# Patient Record
Sex: Male | Born: 1955 | Marital: Single | Smoking: Former smoker
Health system: Southern US, Community
[De-identification: ages and names within clinical notes are randomized; demographics above are authoritative.]

## PROBLEM LIST (undated history)

## (undated) DIAGNOSIS — D3A012 Benign carcinoid tumor of the ileum: Secondary | ICD-10-CM

## (undated) DIAGNOSIS — D3A029 Benign carcinoid tumor of the large intestine, unspecified portion: Secondary | ICD-10-CM

## (undated) DIAGNOSIS — K219 Gastro-esophageal reflux disease without esophagitis: Secondary | ICD-10-CM

## (undated) HISTORY — DX: Benign carcinoid tumor of the large intestine, unspecified portion: D3A.029

## (undated) HISTORY — PX: INGUINAL HERNIA REPAIR: SUR1180

## (undated) HISTORY — DX: Gastro-esophageal reflux disease without esophagitis: K21.9

## (undated) HISTORY — DX: Benign carcinoid tumor of the ileum: D3A.012

---

## 2011-02-14 HISTORY — PX: HEMICOLECTOMY: SHX854

## 2011-10-18 ENCOUNTER — Encounter: Payer: Managed Care, Other (non HMO) | Admitting: Internal Medicine

## 2011-10-18 DIAGNOSIS — D3A00 Benign carcinoid tumor of unspecified site: Secondary | ICD-10-CM

## 2011-10-20 ENCOUNTER — Other Ambulatory Visit: Payer: Self-pay | Admitting: *Deleted

## 2011-10-20 ENCOUNTER — Encounter: Payer: Self-pay | Admitting: *Deleted

## 2011-10-23 ENCOUNTER — Encounter: Payer: Self-pay | Admitting: Internal Medicine

## 2011-10-23 ENCOUNTER — Ambulatory Visit: Payer: Managed Care, Other (non HMO) | Admitting: Internal Medicine

## 2011-10-23 ENCOUNTER — Ambulatory Visit (INDEPENDENT_AMBULATORY_CARE_PROVIDER_SITE_OTHER): Payer: Managed Care, Other (non HMO) | Admitting: Internal Medicine

## 2011-10-23 VITALS — BP 130/74 | HR 68 | Ht 74.0 in | Wt 182.4 lb

## 2011-10-23 DIAGNOSIS — D3A012 Benign carcinoid tumor of the ileum: Secondary | ICD-10-CM

## 2011-10-23 DIAGNOSIS — Z136 Encounter for screening for cardiovascular disorders: Secondary | ICD-10-CM

## 2011-10-23 DIAGNOSIS — Z01810 Encounter for preprocedural cardiovascular examination: Secondary | ICD-10-CM

## 2011-10-23 DIAGNOSIS — D3A00 Benign carcinoid tumor of unspecified site: Secondary | ICD-10-CM

## 2011-10-23 NOTE — Assessment & Plan Note (Signed)
Echo assessment here is important as noted above. His cardio vascular risks however are quite low for surgery. His functional status is good. His family history is noteworthy that his electrocardiogram is normal and he has no known coronary disease of his own. If his echo is normal for ventricular function review, surgery for his carcinoid can be undertaken with acceptable risk.

## 2011-10-23 NOTE — Assessment & Plan Note (Signed)
The patient has a carcinoid tumor. Apparently there are some perineal mets and he is undergoing further evaluation for metastatic disease which is apparently quite common. Cardiac assessment by echo is indicated to look for valvular involvement of the tricuspid and or pulmonic valves. Apparently serial cardiac assessment is indicated if there is residual tumor.

## 2011-10-23 NOTE — Progress Notes (Signed)
CARDIOLOGY CONSULT NOTE  Patient ID: Michael Wood, MRN: [id number], DOB/AGE: November 10, 1955 56 y.o. Admit date: (Not on file) Date of Consult: 10/23/2011  Primary Physician: Ignatius Specking., MD Primary Cardiologist: new  Chief Complaint: preoperative screeening   HPI Michael Wood is a 56 y.o. male  seen at the request of Dr. 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  This was found during routine colonoscopy  He has no cardiac symptoms. He has a strong family history of heart disease. He denies chest pain. He is able to climb 2 or 3 flights of stairs without shortness of breath. 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 He has, he thinks, of a low HDL low LDL. He does not smoke.  He denies flushing and diarrhea.   Past Medical History  Diagnosis Date  . Carcinoid tumor of ileum     ileocecal valve  . Esophageal reflux       Surgical History:  Past Surgical History  Procedure Date  . Inguinal hernia repair      Home Meds: Prior to Admission medications   Medication Sig Start Date End Date Taking? Authorizing Provider  aspirin 81 MG tablet Take 81 mg by mouth daily.   Yes Historical Provider, MD  Multiple Vitamin (MULTIVITAMIN) tablet Take 1 tablet by mouth daily.   Yes Historical Provider, MD  omeprazole (PRILOSEC) 20 MG capsule Take 1 capsule by mouth Daily. 09/22/11  Yes Historical Provider, MD    Allergies: No Known Allergies  History   Social History  . Marital Status: Unknown    Spouse Name: N/A    Number of Children: N/A  . Years of Education: N/A   Occupational History  . Not on file.   Social History Main Topics  . Smoking status: Former Smoker -- 1.0 packs/day for 35 years    Types: Cigarettes    Quit date: 02/14/2008  . Smokeless tobacco: Not on file  . Alcohol Use: Not on file  . Drug Use: Not on file  . Sexually Active: Not on file   Other Topics Concern  . Not on file   Social History Narrative  . No  narrative on file    Married. He stopped smoking when his wife a couple of strokes apparently in the context of collagen vascular disease  Family History  Problem Relation Age of Onset  . Heart attack Father 45  . Stroke Mother   . Diabetes Sister   . Other Daughter     healthy  . Lung cancer Other     uncle  . Heart disease Other     paternal side     ROS:  Please see the history of present illness.   [id number]  All other systems reviewed and negative.    Physical Exam: Blood pressure 130/74, pulse 68, height 6\' 2"  (1.88 m), weight 182 lb 6.4 oz (82.736 kg). General: Well developed, well nourished male in no acute distress. Head: Normocephalic, atraumatic, sclera non-icteric, no xanthomas, nares are without discharge. Lymph Nodes:  none Neck: Negative for carotid bruits. JVD not elevated. Back:without scoliosis kyphosis  Lungs: Clear bilaterally to auscultation without wheezes, rales, or rhonchi. Breathing is unlabored. Heart: RRR with S1 S2. No murmur . No rubs, or gallops appreciated. Abdomen: Soft, non-tender, non-distended with normoactive bowel sounds. No hepatomegaly. No rebound/guarding. No obvious abdominal masses. Msk:  Strength and tone appear normal for age. Extremities: No clubbing or cyanosis. No  edema.  Distal  pedal pulses are 2+ and equal bilaterally. Skin: Warm and Dry Neuro: Alert and oriented X 3. CN III-XII intact Grossly normal sensory and motor function . Psych:  Responds to questions appropriately with a normal affect.      Labs:   Radiology/Studies:  No results found.  EKG: Sinus rhythm at 68 Intervals 13/09/37 Low-voltage limb leads Otherwise normal   Assessment and Plan:  Sherryl Manges

## 2011-10-23 NOTE — Patient Instructions (Signed)
   Echo  Office will contact with results  Based on above results

## 2011-11-01 ENCOUNTER — Other Ambulatory Visit: Payer: Self-pay

## 2011-11-01 ENCOUNTER — Other Ambulatory Visit (INDEPENDENT_AMBULATORY_CARE_PROVIDER_SITE_OTHER): Payer: Managed Care, Other (non HMO)

## 2011-11-01 DIAGNOSIS — Z136 Encounter for screening for cardiovascular disorders: Secondary | ICD-10-CM

## 2011-11-01 DIAGNOSIS — I369 Nonrheumatic tricuspid valve disorder, unspecified: Secondary | ICD-10-CM

## 2011-11-01 DIAGNOSIS — D3A00 Benign carcinoid tumor of unspecified site: Secondary | ICD-10-CM

## 2011-11-06 ENCOUNTER — Telehealth: Payer: Self-pay | Admitting: *Deleted

## 2011-11-06 NOTE — Telephone Encounter (Signed)
Patient notified

## 2011-11-06 NOTE — Telephone Encounter (Signed)
Notes Recorded by Duke Salvia, MD on 11/03/2011 at 5:49 PM Please Inform Patient Normal echo without evidence of carcinoid Also plz fax to his surgeon  Attempted to notify patient.  Left message.    Copy of results faxed to [facility] & Dr. Eliseo Squires office this morning.

## 2011-11-14 ENCOUNTER — Ambulatory Visit: Payer: Managed Care, Other (non HMO) | Admitting: Cardiology

## 2011-12-05 ENCOUNTER — Encounter: Payer: Managed Care, Other (non HMO) | Admitting: Internal Medicine

## 2011-12-05 DIAGNOSIS — C7A029 Malignant carcinoid tumor of the large intestine, unspecified portion: Secondary | ICD-10-CM

## 2011-12-11 DIAGNOSIS — C7A021 Malignant carcinoid tumor of the cecum: Secondary | ICD-10-CM

## 2012-01-08 DIAGNOSIS — C7A021 Malignant carcinoid tumor of the cecum: Secondary | ICD-10-CM

## 2012-02-05 DIAGNOSIS — C7A098 Malignant carcinoid tumors of other sites: Secondary | ICD-10-CM

## 2012-04-26 DIAGNOSIS — C7A019 Malignant carcinoid tumor of the small intestine, unspecified portion: Secondary | ICD-10-CM

## 2012-05-06 ENCOUNTER — Encounter: Payer: Managed Care, Other (non HMO) | Admitting: Internal Medicine

## 2012-05-06 DIAGNOSIS — D3A00 Benign carcinoid tumor of unspecified site: Secondary | ICD-10-CM

## 2012-05-27 DIAGNOSIS — C7A00 Malignant carcinoid tumor of unspecified site: Secondary | ICD-10-CM

## 2012-06-24 DIAGNOSIS — D3A00 Benign carcinoid tumor of unspecified site: Secondary | ICD-10-CM

## 2012-07-22 DIAGNOSIS — C7A019 Malignant carcinoid tumor of the small intestine, unspecified portion: Secondary | ICD-10-CM

## 2012-08-19 DIAGNOSIS — C7A021 Malignant carcinoid tumor of the cecum: Secondary | ICD-10-CM

## 2012-08-29 DIAGNOSIS — C7A021 Malignant carcinoid tumor of the cecum: Secondary | ICD-10-CM

## 2012-09-16 DIAGNOSIS — E340 Carcinoid syndrome: Secondary | ICD-10-CM

## 2012-09-16 DIAGNOSIS — D3A00 Benign carcinoid tumor of unspecified site: Secondary | ICD-10-CM

## 2012-10-16 DIAGNOSIS — C7A021 Malignant carcinoid tumor of the cecum: Secondary | ICD-10-CM

## 2012-10-16 DIAGNOSIS — E340 Carcinoid syndrome: Secondary | ICD-10-CM

## 2012-10-16 DIAGNOSIS — C7A012 Malignant carcinoid tumor of the ileum: Secondary | ICD-10-CM

## 2012-11-15 DIAGNOSIS — D3A021 Benign carcinoid tumor of the cecum: Secondary | ICD-10-CM

## 2012-11-15 DIAGNOSIS — R05 Cough: Secondary | ICD-10-CM

## 2012-11-18 DIAGNOSIS — C7A021 Malignant carcinoid tumor of the cecum: Secondary | ICD-10-CM

## 2012-11-18 DIAGNOSIS — C7B8 Other secondary neuroendocrine tumors: Secondary | ICD-10-CM

## 2012-12-16 DIAGNOSIS — C7B8 Other secondary neuroendocrine tumors: Secondary | ICD-10-CM

## 2012-12-16 DIAGNOSIS — C7A012 Malignant carcinoid tumor of the ileum: Secondary | ICD-10-CM

## 2013-01-13 DIAGNOSIS — C7A021 Malignant carcinoid tumor of the cecum: Secondary | ICD-10-CM

## 2013-01-13 DIAGNOSIS — C7A012 Malignant carcinoid tumor of the ileum: Secondary | ICD-10-CM

## 2013-01-13 DIAGNOSIS — E340 Carcinoid syndrome: Secondary | ICD-10-CM

## 2017-04-27 ENCOUNTER — Other Ambulatory Visit (HOSPITAL_COMMUNITY): Payer: Self-pay | Admitting: Pharmacist

## 2017-05-03 ENCOUNTER — Other Ambulatory Visit (HOSPITAL_COMMUNITY): Payer: Self-pay | Admitting: Hematology

## 2017-05-03 NOTE — Progress Notes (Signed)
START OFF PATHWAY REGIMEN - [Other Dx]   [id number]:Lanreotide q28 Days:   A cycles is every 28 days:     Lanreotide   **Always confirm dose/schedule in your pharmacy ordering system**  Patient Characteristics: Intent of Therapy: Non-Curative / Palliative Intent, Discussed with Patient 

## 2017-05-04 ENCOUNTER — Other Ambulatory Visit (HOSPITAL_COMMUNITY): Payer: Self-pay | Admitting: Hematology

## 2017-05-04 ENCOUNTER — Other Ambulatory Visit (HOSPITAL_COMMUNITY): Payer: Self-pay | Admitting: Pharmacist

## 2017-05-10 ENCOUNTER — Inpatient Hospital Stay (HOSPITAL_COMMUNITY): Payer: Managed Care, Other (non HMO) | Attending: Hematology | Admitting: Hematology

## 2017-05-10 ENCOUNTER — Encounter (HOSPITAL_COMMUNITY): Payer: Self-pay | Admitting: Hematology

## 2017-05-10 ENCOUNTER — Inpatient Hospital Stay (HOSPITAL_COMMUNITY): Payer: Managed Care, Other (non HMO)

## 2017-05-10 DIAGNOSIS — C7B02 Secondary carcinoid tumors of liver: Secondary | ICD-10-CM | POA: Insufficient documentation

## 2017-05-10 DIAGNOSIS — C7A012 Malignant carcinoid tumor of the ileum: Secondary | ICD-10-CM | POA: Insufficient documentation

## 2017-05-10 DIAGNOSIS — D3A012 Benign carcinoid tumor of the ileum: Secondary | ICD-10-CM

## 2017-05-10 MED ORDER — LANREOTIDE ACETATE 120 MG/0.5ML ~~LOC~~ SOLN
120.0000 mg | Freq: Once | SUBCUTANEOUS | Status: AC
  Administered 2017-05-10: 120 mg via SUBCUTANEOUS
  Filled 2017-05-10: qty 120

## 2017-05-10 NOTE — Progress Notes (Signed)
Michael Wood presents today for injection per the provider's orders.  Lanreotide administration without incident; see MAR for injection details.  Patient tolerated procedure well and without incident.  No questions or complaints noted at this time.  Discharged ambulatory.

## 2017-05-10 NOTE — Progress Notes (Signed)
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  HISTORY OF PRESENTING ILLNESS:  Michael Wood 62 y.o. 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  He had episodes of night sweats in October 2018 and November 2018 in December 2018.  He does have occasional flushing episodes.  He presented with right upper quadrant pain and lab abnormalities including elevated alkaline phosphatase.  CT scan done showed liver metastasis on 03/17/2017.  He had a liver biopsy done on 03/29/2017 which showed metastatic neuroendocrine tumor, grade 2.  He is working a full-time job at a [employer] in [place].  His right upper quadrant pain comes on with certain movements, 5-6 out of 10, lasts only a few seconds, occasionally sharp.  It goes away on its own.  He denies any night sweats at this time.  He has occasional diarrhea depends on what he eats.  His appetite is normal.    MEDICAL HISTORY:  Past Medical History:  Diagnosis Date  . Carcinoid tumor of colon   . Carcinoid tumor of ileum    ileocecal valve  . Esophageal reflux     SURGICAL HISTORY: Past Surgical History:  Procedure Laterality Date  . HEMICOLECTOMY Right 2013  . INGUINAL HERNIA REPAIR      SOCIAL HISTORY: Social History   Socioeconomic History  . Marital status: Single    Spouse name: Not on file  . Number of children: Not on file  . Years of education: Not on file  . Highest education level: Not on file  Occupational History  . Not on file  Social Needs  . Financial resource strain: Not on file  . Food insecurity:    Worry: Not on file    Inability: Not on file  . Transportation needs:   Medical: Not on file    Non-medical: Not on file  Tobacco Use  . Smoking status: Former Smoker    Packs/day: 1.00    Years: 35.00    Pack years: 35.00    Types: Cigarettes    Last attempt to quit: 02/14/2008    Years since quitting: 9.2  . Smokeless tobacco: Never Used  Substance and Sexual Activity  . Alcohol use: Never    Frequency: Never  . Drug use: Never  . Sexual activity: Not on file  Lifestyle  . Physical activity:    Days per week: Not on file    Minutes per session: Not on file  . Stress: Not on file  Relationships  . Social connections:    Talks on phone: Not on file    Gets together: Not on file    Attends religious service: Not on file    Active member of club or organization: Not on file    Attends meetings of clubs or organizations: Not on file    Relationship status: Not on file  . Intimate partner violence:    Fear of current or ex partner: Not on file    Emotionally abused: Not on file    Physically abused: Not on file    Forced sexual activity: Not on file  Other Topics Concern  . Not on file  Social History Narrative  . Not on  file    FAMILY HISTORY: Family History  Problem Relation Age of Onset  . Heart attack Father 97  . Stroke Mother   . Diabetes Sister   . Other Daughter        healthy  . Lung cancer Other        uncle  . Heart disease Other        paternal side    ALLERGIES:  has No Known Allergies.  MEDICATIONS:  No current outpatient medications on file.   No current facility-administered medications for this visit.     REVIEW OF SYSTEMS:   Constitutional: Denies fevers, chills or abnormal night sweats.  Occasional flushing present. Eyes: Denies blurriness of vision, double vision or watery eyes Ears, nose, mouth, throat, and face: Denies mucositis or sore throat Respiratory: Denies cough, dyspnea or wheezes Cardiovascular: Denies palpitation, chest discomfort or lower extremity swelling Gastrointestinal:  Denies nausea,  heartburn or change in bowel habits.  Occasional pain in the right upper quadrant on certain movements.  Occasional diarrhea depending on what he eats. Skin: Denies abnormal skin rashes Lymphatics: Denies new lymphadenopathy or easy bruising Neurological:Denies numbness, tingling or new weaknesses Behavioral/Psych: Mood is stable, no new changes  All other systems were reviewed with the patient and are negative.  PHYSICAL EXAMINATION: ECOG PERFORMANCE STATUS: 0 - Asymptomatic  Vitals:   05/10/17 1125  BP: 128/78  Pulse: 80  Resp: 18  Temp: 98.2 F (36.8 C)  SpO2: 98%   Filed Weights   05/10/17 1124 05/10/17 1125  Weight: 161 lb 3.2 oz (73.1 kg) 161 lb 3.2 oz (73.1 kg)    GENERAL:alert, no distress and comfortable SKIN: skin color, texture, turgor are normal, no rashes or significant lesions EYES: normal, conjunctiva are pink and non-injected, sclera clear OROPHARYNX:no exudate, no erythema and lips, buccal mucosa, and tongue normal  NECK: supple, thyroid normal size, non-tender, without nodularity LYMPH:  no palpable lymphadenopathy in the cervical, axillary or inguinal LUNGS: clear to auscultation and percussion with normal breathing effort HEART: regular rate & rhythm and no murmurs and no lower extremity edema ABDOMEN: Border of the liver felt on deep inspiration.  No palpable splenomegaly. Musculoskeletal:no cyanosis of digits and no clubbing  PSYCH: alert & oriented x 3 with fluent speech NEURO: no focal motor/sensory deficits  LABORATORY DATA:  I have reviewed the data as listed No results found for: WBC, HGB, HCT, MCV, PLT   Chemistry   No results found for: NA, K, CL, CO2, BUN, CREATININE, GLU No results found for: CALCIUM, ALKPHOS, AST, ALT, BILITOT     RADIOGRAPHIC STUDIES: I have personally reviewed the radiological images as listed and agreed with the findings in the report. No results found.  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  His right upper quadrant pain improved after that.  He has some pain which lasts a few seconds on certain movements in the right upper quadrant region.  He has occasional flushing which is stable.  He denies any night sweats.  He has on and off diarrhea based on what he eats.  His appetite is normal.  He is working full-time job.  He will proceed with lanreotide 120 mg every 4 weeks today and in 1 month.  I will see him back in 8 weeks with a repeat CT scan, blood and urine tests.  With somatostatin analogs,  less than 10% have objective tumor shrinkage, although prolonged periods of stable disease are seen in majority of patients.  If there is progression, adding everolimus will be an option.  Peptide receptor radioligand therapy (PRRT) is also an option following progression on the somatostatin analogue.  Orders Placed This Encounter  Procedures  . CT Abdomen W Contrast    Standing Status:   Future    Standing Expiration Date:   05/10/2018    Order Specific Question:   If indicated for the ordered procedure, I authorize the administration of contrast media per Radiology protocol    Answer:   Yes    Order Specific Question:   Preferred imaging location?    Answer:   [facility]    Order Specific Question:   Radiology Contrast Protocol - do NOT remove file path    Answer:   [email or web address]    Order Specific Question:   Reason for Exam additional comments    Answer:   Follow-up of metastatic carcinoid to the liver, compare with CT scans from 03/17/2017 at outside facility, loaded on PACS    All questions were answered. The patient knows to call the clinic with any problems, questions or concerns.     Derek Jack, MD 05/10/2017 4:59 PM

## 2017-05-10 NOTE — Assessment & Plan Note (Signed)
1.  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  His right upper quadrant pain improved after that.  He has some pain which lasts a few seconds on certain movements in the right upper quadrant region.  He has occasional flushing which is stable.  He denies any night sweats.  He has on and off diarrhea based on what he eats.  His appetite is normal.  He is working full-time job.  He will proceed with lanreotide 120 mg every 4 weeks today and in 1 month.  I will see him back in 8 weeks with a repeat CT scan, blood and urine tests.  With somatostatin analogs, less than 10% have objective tumor shrinkage, although prolonged periods of stable disease are seen in majority of patients.  If there is progression, adding everolimus will be an option.  Peptide receptor radioligand therapy (PRRT) is also an option following progression on the somatostatin analogue.

## 2017-06-07 ENCOUNTER — Encounter (HOSPITAL_COMMUNITY): Payer: Self-pay

## 2017-06-07 ENCOUNTER — Other Ambulatory Visit: Payer: Self-pay

## 2017-06-07 ENCOUNTER — Emergency Department (HOSPITAL_COMMUNITY)
Admission: EM | Admit: 2017-06-07 | Discharge: 2017-06-07 | Disposition: A | Discharge status: Home / Self Care | Payer: Managed Care, Other (non HMO) | Attending: Emergency Medicine | Admitting: Emergency Medicine

## 2017-06-07 ENCOUNTER — Emergency Department (HOSPITAL_COMMUNITY): Payer: Managed Care, Other (non HMO)

## 2017-06-07 ENCOUNTER — Inpatient Hospital Stay (HOSPITAL_COMMUNITY): Payer: Managed Care, Other (non HMO) | Attending: Hematology

## 2017-06-07 VITALS — BP 151/67 | HR 94 | Temp 102.1°F | Resp 18

## 2017-06-07 DIAGNOSIS — C787 Secondary malignant neoplasm of liver and intrahepatic bile duct: Secondary | ICD-10-CM | POA: Insufficient documentation

## 2017-06-07 DIAGNOSIS — R509 Fever, unspecified: Secondary | ICD-10-CM | POA: Insufficient documentation

## 2017-06-07 DIAGNOSIS — C7B02 Secondary carcinoid tumors of liver: Secondary | ICD-10-CM | POA: Insufficient documentation

## 2017-06-07 DIAGNOSIS — D3A012 Benign carcinoid tumor of the ileum: Secondary | ICD-10-CM

## 2017-06-07 DIAGNOSIS — Z87891 Personal history of nicotine dependence: Secondary | ICD-10-CM | POA: Diagnosis not present

## 2017-06-07 DIAGNOSIS — C7A012 Malignant carcinoid tumor of the ileum: Secondary | ICD-10-CM | POA: Insufficient documentation

## 2017-06-07 DIAGNOSIS — R079 Chest pain, unspecified: Secondary | ICD-10-CM | POA: Diagnosis not present

## 2017-06-07 LAB — CBC WITH DIFFERENTIAL/PLATELET
Basophils Absolute: 0 10*3/uL (ref 0.0–0.1)
Basophils Relative: 0 %
Eosinophils Absolute: 0 10*3/uL (ref 0.0–0.7)
Eosinophils Relative: 0 %
HEMATOCRIT: 37.5 % — AB (ref 39.0–52.0)
HEMOGLOBIN: 12.4 g/dL — AB (ref 13.0–17.0)
LYMPHS ABS: 1.1 10*3/uL (ref 0.7–4.0)
Lymphocytes Relative: 11 %
MCH: 30.3 pg (ref 26.0–34.0)
MCHC: 33.1 g/dL (ref 30.0–36.0)
MCV: 91.7 fL (ref 78.0–100.0)
MONOS PCT: 13 %
Monocytes Absolute: 1.3 10*3/uL — ABNORMAL HIGH (ref 0.1–1.0)
NEUTROS ABS: 7.3 10*3/uL (ref 1.7–7.7)
Neutrophils Relative %: 76 %
Platelets: 372 10*3/uL (ref 150–400)
RBC: 4.09 MIL/uL — ABNORMAL LOW (ref 4.22–5.81)
RDW: 15.1 % (ref 11.5–15.5)
WBC: 9.7 10*3/uL (ref 4.0–10.5)

## 2017-06-07 LAB — COMPREHENSIVE METABOLIC PANEL
ALK PHOS: 371 U/L — AB (ref 38–126)
ALT: 30 U/L (ref 17–63)
ANION GAP: 14 (ref 5–15)
AST: 40 U/L (ref 15–41)
Albumin: 2.8 g/dL — ABNORMAL LOW (ref 3.5–5.0)
BUN: 11 mg/dL (ref 6–20)
CALCIUM: 8.6 mg/dL — AB (ref 8.9–10.3)
CO2: 26 mmol/L (ref 22–32)
Chloride: 97 mmol/L — ABNORMAL LOW (ref 101–111)
Creatinine, Ser: 1.04 mg/dL (ref 0.61–1.24)
GFR calc non Af Amer: >60 mL/min (ref >60)
Glucose, Bld: 134 mg/dL — ABNORMAL HIGH (ref 65–99)
Potassium: 3.8 mmol/L (ref 3.5–5.1)
Sodium: 137 mmol/L (ref 135–145)
TOTAL PROTEIN: 7.2 g/dL (ref 6.5–8.1)
Total Bilirubin: 2.2 mg/dL — ABNORMAL HIGH (ref 0.3–1.2)

## 2017-06-07 MED ORDER — IBUPROFEN 400 MG PO TABS
400.0000 mg | ORAL_TABLET | Freq: Once | ORAL | Status: AC
  Administered 2017-06-07: 400 mg via ORAL
  Filled 2017-06-07: qty 1

## 2017-06-07 MED ORDER — LANREOTIDE ACETATE 120 MG/0.5ML ~~LOC~~ SOLN
120.0000 mg | Freq: Once | SUBCUTANEOUS | Status: DC
  Filled 2017-06-07: qty 120

## 2017-06-07 NOTE — Discharge Instructions (Signed)
Contact your primary care physician to be rechecked tomorrow or early next week.  Return to the emergency if you develop vomiting, abdominal pain, shortness of breath or if you feel worse for any reason

## 2017-06-07 NOTE — ED Triage Notes (Signed)
Pt is a cancer patient. Was sent over by cancer center to be evaluated due to sharp stabbing pain in right rib that radiates to upper shoulder. Only happens when he takes a deep breath, laugh, or sudden movement.

## 2017-06-07 NOTE — Progress Notes (Signed)
Pt reports fever, chills, night sweats, RU chest pain and right upper back pain, sharp, worse with deep breaths and movement, x 2 weeks.  Pt is noted to be febrile on assessment of VS.  Dr. Delton Coombes notified of pt complaints and vital signs.  Hold lanreotide injection today and send to ED for further evaluation per MD.  Pt escorted to ED triage.

## 2017-06-07 NOTE — ED Provider Notes (Signed)
[facility] EMERGENCY DEPARTMENT Provider Note   CSN: [id number] Arrival date & time: 06/07/17  1534     History   Chief Complaint Chief Complaint  Patient presents with  . Chest Pain  . Shoulder Pain    HPI Michael Wood is a 62 y.o. male.  HPI She reports intermittent fevers October 2018 he has had right-sided chest pain at right mid ribs for the past 2 weeks.  He denies cough denies shortness of breath denies nausea or vomiting denies abdominal pain.  He was noted to have a temperature of 102.1 today immediately prior to getting lanreotide injection.  Not received any treatment today however he is been treating himself with Advil for fevers for the past 2 weeks.  He was sent here for further evaluation.  Denies any shoulder pain.  No other associated symptoms.  His lanreotide injection was withheld.  Urinary symptoms denies abdominal pain denies headache Past Medical History:  Diagnosis Date  . Carcinoid tumor of colon   . Carcinoid tumor of ileum    ileocecal valve  . Esophageal reflux     Patient Active Problem List   Diagnosis Date Noted  . Metastatic malignant carcinoid tumor to liver ([place]) 05/10/2017  . Preoperative cardiovascular examination 10/23/2011  . Carcinoid tumor of ileum     Past Surgical History:  Procedure Laterality Date  . HEMICOLECTOMY Right 2013  . INGUINAL HERNIA REPAIR          Home Medications    Prior to Admission medications   Not on File    Family History Family History  Problem Relation Age of Onset  . Heart attack Father 35  . Stroke Mother   . Diabetes Sister   . Other Daughter        healthy  . Lung cancer Other        uncle  . Heart disease Other        paternal side    Social History Social History   Tobacco Use  . Smoking status: Former Smoker    Packs/day: 1.00    Years: 35.00    Pack years: 35.00    Types: Cigarettes    Last attempt to quit: 02/14/2008    Years since quitting: 9.3  . Smokeless tobacco:  Never Used  Substance Use Topics  . Alcohol use: Never    Frequency: Never  . Drug use: Never     Allergies   Patient has no known allergies.   Review of Systems Review of Systems  Cardiovascular: Positive for chest pain.  Allergic/Immunologic: Positive for immunocompromised state.  All other systems reviewed and are negative.    Physical Exam Updated Vital Signs BP (!) 144/69   Pulse 88   Temp (!) 101.5 F (38.6 C) (Oral)   Resp 15   Ht 6\' 2"  (1.88 m)   Wt 74.8 kg (165 lb)   SpO2 98%   BMI 21.18 kg/m   Physical Exam  Constitutional: He appears well-developed and well-nourished.  HENT:  Head: Normocephalic and atraumatic.  Eyes: Pupils are equal, round, and reactive to light. Conjunctivae are normal.  Neck: Neck supple. No tracheal deviation present. No thyromegaly present.  Cardiovascular: Normal rate and regular rhythm.  No murmur heard. Pulmonary/Chest: Effort normal and breath sounds normal.  Abdominal: Soft. Bowel sounds are normal. He exhibits no distension. There is no tenderness.  Musculoskeletal: Normal range of motion. He exhibits no edema or tenderness.  Neurological: He is alert. Coordination normal.  Skin: Skin is warm and dry. No rash noted.  Psychiatric: He has a normal mood and affect.  Nursing note and vitals reviewed.    ED Treatments / Results  Labs (all labs ordered are listed, but only abnormal results are displayed) Labs Reviewed  CBC WITH DIFFERENTIAL/PLATELET - Abnormal; Notable for the following components:      Result Value   RBC 4.09 (*)    Hemoglobin 12.4 (*)    HCT 37.5 (*)    Monocytes Absolute 1.3 (*)    All other components within normal limits  COMPREHENSIVE METABOLIC PANEL - Abnormal; Notable for the following components:   Chloride 97 (*)    Glucose, Bld 134 (*)    Calcium 8.6 (*)    Albumin 2.8 (*)    Alkaline Phosphatase 371 (*)    Total Bilirubin 2.2 (*)    All other components within normal limits  CULTURE,  BLOOD (ROUTINE X 2)  CULTURE, BLOOD (ROUTINE X 2)   Results for orders placed or performed during the hospital encounter of 06/07/17  CBC with Differential/Platelet  Result Value Ref Range   WBC 9.7 4.0 - 10.5 K/uL   RBC 4.09 (L) 4.22 - 5.81 MIL/uL   Hemoglobin 12.4 (L) 13.0 - 17.0 g/dL   HCT 37.5 (L) 39.0 - 52.0 %   MCV 91.7 78.0 - 100.0 fL   MCH 30.3 26.0 - 34.0 pg   MCHC 33.1 30.0 - 36.0 g/dL   RDW 15.1 11.5 - 15.5 %   Platelets 372 150 - 400 K/uL   Neutrophils Relative % 76 %   Neutro Abs 7.3 1.7 - 7.7 K/uL   Lymphocytes Relative 11 %   Lymphs Abs 1.1 0.7 - 4.0 K/uL   Monocytes Relative 13 %   Monocytes Absolute 1.3 (H) 0.1 - 1.0 K/uL   Eosinophils Relative 0 %   Eosinophils Absolute 0.0 0.0 - 0.7 K/uL   Basophils Relative 0 %   Basophils Absolute 0.0 0.0 - 0.1 K/uL  Comprehensive metabolic panel  Result Value Ref Range   Sodium 137 135 - 145 mmol/L   Potassium 3.8 3.5 - 5.1 mmol/L   Chloride 97 (L) 101 - 111 mmol/L   CO2 26 22 - 32 mmol/L   Glucose, Bld 134 (H) 65 - 99 mg/dL   BUN 11 6 - 20 mg/dL   Creatinine, Ser 1.04 0.61 - 1.24 mg/dL   Calcium 8.6 (L) 8.9 - 10.3 mg/dL   Total Protein 7.2 6.5 - 8.1 g/dL   Albumin 2.8 (L) 3.5 - 5.0 g/dL   AST 40 15 - 41 U/L   ALT 30 17 - 63 U/L   Alkaline Phosphatase 371 (H) 38 - 126 U/L   Total Bilirubin 2.2 (H) 0.3 - 1.2 mg/dL   GFR calc non Af Amer >60 >60 mL/min   GFR calc Af Amer >60 >60 mL/min   Anion gap 14 5 - 15   Dg Chest 2 View  Result Date: 06/07/2017 CLINICAL DATA:  62 y/o M; fever, chills, night sweats, right-sided chest pain, for 2 weeks. History of liver cancer and carcinoid tumor of the ileum and colon. EXAM: CHEST - 2 VIEW COMPARISON:  None. FINDINGS: The heart size and mediastinal contours are within normal limits. Both lungs are clear. The visualized skeletal structures are unremarkable. IMPRESSION: No active cardiopulmonary disease. Electronically Signed   By: Kristine Garbe M.D.   On: 06/07/2017  17:05    EKG None  Radiology Dg Chest 2 View  Result Date: 06/07/2017 CLINICAL DATA:  62 y/o M; fever, chills, night sweats, right-sided chest pain, for 2 weeks. History of liver cancer and carcinoid tumor of the ileum and colon. EXAM: CHEST - 2 VIEW COMPARISON:  None. FINDINGS: The heart size and mediastinal contours are within normal limits. Both lungs are clear. The visualized skeletal structures are unremarkable. IMPRESSION: No active cardiopulmonary disease. Electronically Signed   By: Kristine Garbe M.D.   On: 06/07/2017 17:05    Procedures Procedures (including critical care time)  Medications Ordered in ED Medications  ibuprofen (ADVIL,MOTRIN) tablet 400 mg (has no administration in time range)     Initial Impression / Assessment and Plan / ED Course  I have reviewed the triage vital signs and the nursing notes.  Pertinent labs & imaging results that were available during my care of the patient were reviewed by me and considered in my medical decision making (see chart for details).     Chest x-ray reviewed by me.  CT scan of abdomen pelvis offered to patient which he declined.  He is not completely nontender on exam and has no GI symptoms.  I have advised patient to return if he feels worse develops vomiting.  Advil for fever.  Follow-up with PMD next week.  I discussed with Dr.Katragadda who is in agreement.Michael Wood patient should call next week to arrange for lanreotide injection Lab work consistent with hyperbilirubinemia and mild anemia.  Patient is not neutropenic.  Blood cultures pending Final Clinical Impressions(s) / ED Diagnoses  Diagnosis febrile illness Final diagnoses:  None    ED Discharge Orders    None       Orlie Dakin, MD 06/07/17 [phone]

## 2017-06-08 ENCOUNTER — Telehealth (HOSPITAL_COMMUNITY): Payer: Self-pay

## 2017-06-08 LAB — BLOOD CULTURE ID PANEL (REFLEXED)
Acinetobacter baumannii: NOT DETECTED
CANDIDA GLABRATA: NOT DETECTED
Candida albicans: NOT DETECTED
Candida krusei: NOT DETECTED
Candida parapsilosis: NOT DETECTED
Candida tropicalis: NOT DETECTED
ENTEROBACTER CLOACAE COMPLEX: NOT DETECTED
Enterobacteriaceae species: NOT DETECTED
Enterococcus species: NOT DETECTED
Escherichia coli: NOT DETECTED
Haemophilus influenzae: NOT DETECTED
Klebsiella oxytoca: NOT DETECTED
Klebsiella pneumoniae: NOT DETECTED
LISTERIA MONOCYTOGENES: NOT DETECTED
METHICILLIN RESISTANCE: NOT DETECTED
NEISSERIA MENINGITIDIS: NOT DETECTED
Proteus species: NOT DETECTED
Pseudomonas aeruginosa: NOT DETECTED
SERRATIA MARCESCENS: NOT DETECTED
STAPHYLOCOCCUS AUREUS BCID: NOT DETECTED
STREPTOCOCCUS AGALACTIAE: NOT DETECTED
STREPTOCOCCUS SPECIES: NOT DETECTED
Staphylococcus species: DETECTED — AB
Streptococcus pneumoniae: NOT DETECTED
Streptococcus pyogenes: NOT DETECTED

## 2017-06-08 NOTE — ED Notes (Signed)
Patient called with Blood Culture results. Patient states that he does not want to come back in.  Patient education given about sepsis. Patient still refuses to return.

## 2017-06-08 NOTE — Telephone Encounter (Signed)
1445 Unable to reach the pt via his home phone number so pt's wife was notified to have pt call us to reschedule his injection that he missed yesterday. Pt's wife stated that the pt's mother is in the hospital but she will relay the message and he may have to come in Monday to have the injection done. She will have him call us back as soon as possible

## 2017-06-09 NOTE — ED Provider Notes (Signed)
The patient returns today for evaluation of positive blood culture following phone call yesterday evening, to inform him of the results and suggest return.  The patient was evaluated 2 days ago, for right upper quadrant abdominal pain associated with fever.  He has ongoing fever for several months, and is currently being treated for endocrine cancer.  He has known metastases to the liver.  He had been due to have an injection, 06/07/2017, but did not get it after the fever and ED evaluation.  His blood culture returned yesterday showing positive 1 out of 2 cultures, for coag negative Staphylococcus.  The patient feels well today.  He was able to eat and ambulate easily.  He feels that he has fevers every day for the last several months.  He was able to work yesterday.  I was able to contact the patient's oncologist, Dr. Delton Coombes, who discussed the case with me, and talked to the patient on the phone.  At this point we feel that the positive blood culture is likely contaminant, and can be followed expectantly.  Dr. Delton Coombes, will see the patient in 2 days for evaluation and treatment as needed.  He plans on giving him his needed injection for chemotherapy, and will likely obtain blood cultures on the return visit.  Patient is in agreement with this plan.  His wife was at his side while we discussed the situation.  Patient did not check into the emergency department for evaluation.   Daleen Bo, MD 06/09/17 [phone]

## 2017-06-10 LAB — CULTURE, BLOOD (ROUTINE X 2): SPECIAL REQUESTS: ADEQUATE

## 2017-06-11 ENCOUNTER — Inpatient Hospital Stay (HOSPITAL_COMMUNITY): Payer: Managed Care, Other (non HMO)

## 2017-06-11 ENCOUNTER — Encounter (HOSPITAL_COMMUNITY): Payer: Self-pay

## 2017-06-11 VITALS — BP 100/61 | HR 77 | Temp 98.0°F | Resp 18

## 2017-06-11 DIAGNOSIS — C7B02 Secondary carcinoid tumors of liver: Secondary | ICD-10-CM | POA: Diagnosis present

## 2017-06-11 DIAGNOSIS — D3A012 Benign carcinoid tumor of the ileum: Secondary | ICD-10-CM

## 2017-06-11 DIAGNOSIS — C7A012 Malignant carcinoid tumor of the ileum: Secondary | ICD-10-CM | POA: Diagnosis present

## 2017-06-11 MED ORDER — LANREOTIDE ACETATE 120 MG/0.5ML ~~LOC~~ SOLN
120.0000 mg | Freq: Once | SUBCUTANEOUS | Status: AC
  Administered 2017-06-11: 120 mg via SUBCUTANEOUS
  Filled 2017-06-11: qty 120

## 2017-06-11 NOTE — Patient Instructions (Signed)
[facility] at [facility] Discharge Instructions  Received Lanreotide injection today. Follow-up as scheduled. Call clinic for any questions or concerns   Thank you for choosing  Cancer Center at [facility] to provide your oncology and hematology care.  To afford each patient quality time with our provider, please arrive at least 15 minutes before your scheduled appointment time.   If you have a lab appointment with the Cancer Center please come in thru the  Main Entrance and check in at the main information desk  You need to re-schedule your appointment should you arrive 10 or more minutes late.  We strive to give you quality time with our providers, and arriving late affects you and other patients whose appointments are after yours.  Also, if you no show three or more times for appointments you may be dismissed from the clinic at the providers discretion.     Again, thank you for choosing [facility].  Our hope is that these requests will decrease the amount of time that you wait before being seen by our physicians.       _____________________________________________________________  Should you have questions after your visit to [facility], please contact our office at [phone] between the hours of 8:30 a.m. and 4:30 p.m.  Voicemails left after 4:30 p.m. will not be returned until the following business day.  For prescription refill requests, have your pharmacy contact our office.       Resources For Cancer Patients and their Caregivers ? American Cancer Society: Can assist with transportation, wigs, general needs, runs Look Good Feel Better.        1-888-227-6333 ? Cancer Care: Provides financial assistance, online support groups, medication/co-pay assistance.  1-800-813-HOPE (4673) ? [facility] Assists [place] cancer patients and their families through emotional , educational and  financial support.  336-427-4357 ? [place] DSS Where to apply for food stamps, Medicaid and utility assistance. 336-342-1394 ? RCATS: Transportation to medical appointments. 336-347-2287 ? Social Security Administration: May apply for disability if have a Stage IV cancer. 336-342-7796 1-800-772-1213 ? [place] Aging, Disability and Transit Services: Assists with nutrition, care and transit needs. 336-349-2343  Cancer Center Support Programs:   > Cancer Support Group  2nd Tuesday of the month 1pm-2pm, Journey Room   > Creative Journey  3rd Tuesday of the month 1130am-1pm, Journey Room    

## 2017-06-11 NOTE — Progress Notes (Signed)
Michael Wood tolerated Lanreotide injection well without complaints or incident.VSS Pt discharged self ambulatory in satisfactory condition 

## 2017-06-12 LAB — CULTURE, BLOOD (ROUTINE X 2)
CULTURE: NO GROWTH
Special Requests: ADEQUATE

## 2017-06-13 DIAGNOSIS — C7A012 Malignant carcinoid tumor of the ileum: Secondary | ICD-10-CM | POA: Insufficient documentation

## 2017-06-13 DIAGNOSIS — R197 Diarrhea, unspecified: Secondary | ICD-10-CM | POA: Insufficient documentation

## 2017-06-13 DIAGNOSIS — C7B02 Secondary carcinoid tumors of liver: Secondary | ICD-10-CM | POA: Insufficient documentation

## 2017-06-21 ENCOUNTER — Other Ambulatory Visit (HOSPITAL_COMMUNITY): Payer: Self-pay | Admitting: *Deleted

## 2017-06-21 DIAGNOSIS — C7B02 Secondary carcinoid tumors of liver: Secondary | ICD-10-CM

## 2017-06-22 ENCOUNTER — Inpatient Hospital Stay (HOSPITAL_COMMUNITY): Payer: Managed Care, Other (non HMO) | Attending: Hematology

## 2017-06-22 ENCOUNTER — Other Ambulatory Visit (HOSPITAL_COMMUNITY): Payer: Managed Care, Other (non HMO)

## 2017-06-22 DIAGNOSIS — R197 Diarrhea, unspecified: Secondary | ICD-10-CM | POA: Insufficient documentation

## 2017-06-22 DIAGNOSIS — C7B02 Secondary carcinoid tumors of liver: Secondary | ICD-10-CM | POA: Diagnosis present

## 2017-06-22 DIAGNOSIS — C7A012 Malignant carcinoid tumor of the ileum: Secondary | ICD-10-CM | POA: Insufficient documentation

## 2017-06-22 DIAGNOSIS — R1011 Right upper quadrant pain: Secondary | ICD-10-CM | POA: Insufficient documentation

## 2017-06-25 ENCOUNTER — Other Ambulatory Visit (HOSPITAL_COMMUNITY): Payer: Self-pay

## 2017-06-25 DIAGNOSIS — C7A012 Malignant carcinoid tumor of the ileum: Secondary | ICD-10-CM | POA: Diagnosis not present

## 2017-06-25 DIAGNOSIS — C7B02 Secondary carcinoid tumors of liver: Secondary | ICD-10-CM

## 2017-06-25 DIAGNOSIS — D3A012 Benign carcinoid tumor of the ileum: Secondary | ICD-10-CM

## 2017-06-25 NOTE — Progress Notes (Signed)
Lab called needing order for patients 24 hour urine. Reviewed with MD to see what was supposed to be ordered. 24 hour urine for 5HIAA entered per Dr. Delton Coombes. Order released and lab notified.

## 2017-06-26 LAB — CHROMOGRANIN A: Chromogranin A: 98 nmol/L — ABNORMAL HIGH (ref 0–5)

## 2017-06-28 ENCOUNTER — Ambulatory Visit (HOSPITAL_COMMUNITY): Payer: Managed Care, Other (non HMO)

## 2017-06-28 LAB — 5 HIAA, QUANTITATIVE, URINE, 24 HOUR
5-HIAA, Ur: 22.4 mg/L
5-HIAA,Quant.,24 Hr Urine: 0.1 mg/24 hr (ref 0.0–14.9)
Total Volume: 1975

## 2017-07-04 ENCOUNTER — Ambulatory Visit (HOSPITAL_COMMUNITY)
Admission: RE | Admit: 2017-07-04 | Discharge: 2017-07-04 | Disposition: A | Discharge status: Home / Self Care | Payer: Managed Care, Other (non HMO) | Source: Ambulatory Visit | Attending: Hematology | Admitting: Hematology

## 2017-07-04 DIAGNOSIS — Z9049 Acquired absence of other specified parts of digestive tract: Secondary | ICD-10-CM | POA: Insufficient documentation

## 2017-07-04 DIAGNOSIS — I700 Atherosclerosis of aorta: Secondary | ICD-10-CM | POA: Insufficient documentation

## 2017-07-04 DIAGNOSIS — C7B02 Secondary carcinoid tumors of liver: Secondary | ICD-10-CM | POA: Insufficient documentation

## 2017-07-04 MED ORDER — IOPAMIDOL (ISOVUE-300) INJECTION 61%
100.0000 mL | Freq: Once | INTRAVENOUS | Status: AC | PRN
  Administered 2017-07-04: 100 mL via INTRAVENOUS

## 2017-07-05 ENCOUNTER — Other Ambulatory Visit (HOSPITAL_COMMUNITY): Payer: Self-pay | Admitting: Hematology

## 2017-07-05 ENCOUNTER — Ambulatory Visit (HOSPITAL_COMMUNITY): Payer: Managed Care, Other (non HMO) | Admitting: Hematology

## 2017-07-05 ENCOUNTER — Ambulatory Visit
Admission: RE | Admit: 2017-07-05 | Discharge: 2017-07-05 | Disposition: A | Discharge status: Home / Self Care | Payer: Self-pay | Source: Ambulatory Visit | Attending: Hematology | Admitting: Hematology

## 2017-07-05 ENCOUNTER — Other Ambulatory Visit: Payer: Self-pay

## 2017-07-05 ENCOUNTER — Inpatient Hospital Stay (HOSPITAL_BASED_OUTPATIENT_CLINIC_OR_DEPARTMENT_OTHER): Payer: Managed Care, Other (non HMO) | Admitting: Hematology

## 2017-07-05 ENCOUNTER — Encounter (HOSPITAL_COMMUNITY): Payer: Self-pay | Admitting: Hematology

## 2017-07-05 VITALS — BP 122/77 | HR 74 | Resp 16 | Wt 160.5 lb

## 2017-07-05 DIAGNOSIS — R197 Diarrhea, unspecified: Secondary | ICD-10-CM | POA: Diagnosis not present

## 2017-07-05 DIAGNOSIS — C229 Malignant neoplasm of liver, not specified as primary or secondary: Secondary | ICD-10-CM

## 2017-07-05 DIAGNOSIS — R1011 Right upper quadrant pain: Secondary | ICD-10-CM

## 2017-07-05 DIAGNOSIS — C7A012 Malignant carcinoid tumor of the ileum: Secondary | ICD-10-CM

## 2017-07-05 DIAGNOSIS — C7B02 Secondary carcinoid tumors of liver: Secondary | ICD-10-CM

## 2017-07-05 NOTE — Progress Notes (Signed)
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  HISTORY OF PRESENTING ILLNESS:  Michael Wood 62 y.o. 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  He had episodes of night sweats in October 2018 and November 2018 in December 2018.  He does have occasional flushing episodes.  He presented with right upper quadrant pain and lab abnormalities including elevated alkaline phosphatase.  CT scan done showed liver metastasis on 03/17/2017.  He had a liver biopsy done on 03/29/2017 which showed metastatic neuroendocrine tumor, grade 2.  He is working a full-time job at a [employer] in [place].  His right upper quadrant pain comes on with certain movements, 5-6 out of 10, lasts only a few seconds, occasionally sharp.  It goes away on its own.  He denies any night sweats at this time.  He has occasional diarrhea depends on what he eats.  His appetite is normal.   INTERVAL HISTORY:  Michael Wood 62 y.o. male returns for routine follow-up for metastatic neuroendocrine liver tumor.   Recently underwent CT abd on 07/04/17, which showed bilateral hepatic liver mets, new since 2016. No evidence of locally recurrent disease in the colon.  We need the radiologist here at [facility] to review previous scans from [place] and compare to recent CT done here.  We will plan to call him with the results once these scans have been compared.   Here today unaccompanied.   His diarrhea and pain are both stable. Denies any wheezing or breathing changes.  Chromogranin A on 06/22/17 was 98; this is lower than his previous chromogranin A levels in [place].    Could consider liver-targeted therapies; discussed  these possible options today.       MEDICAL HISTORY:  Past Medical History:  Diagnosis Date  . Carcinoid tumor of colon   . Carcinoid tumor of ileum    ileocecal valve  . Esophageal reflux     SURGICAL HISTORY: Past Surgical History:  Procedure Laterality Date  . HEMICOLECTOMY Right 2013  . INGUINAL HERNIA REPAIR      SOCIAL HISTORY: Social History   Socioeconomic History  . Marital status: Single    Spouse name: Not on file  . Number of children: Not on file  . Years of education: Not on file  . Highest education level: Not on file  Occupational History  . Not on file  Social Needs  . Financial resource strain: Not on file  . Food insecurity:    Worry: Not on file    Inability: Not on file  . Transportation needs:    Medical: Not on file    Non-medical: Not on file  Tobacco Use  . Smoking status: Former Smoker    Packs/day: 1.00    Years: 35.00    Pack years: 35.00    Types: Cigarettes    Last attempt to quit: 02/14/2008    Years since quitting: 9.3  . Smokeless tobacco: Never Used  Substance and Sexual Activity  . Alcohol use: Never    Frequency: Never  . Drug use: Never  . Sexual activity: Not on file  Lifestyle  . Physical activity:    Days per week: Not on file  Minutes per session: Not on file  . Stress: Not on file  Relationships  . Social connections:    Talks on phone: Not on file    Gets together: Not on file    Attends religious service: Not on file    Active member of club or organization: Not on file    Attends meetings of clubs or organizations: Not on file    Relationship status: Not on file  . Intimate partner violence:    Fear of current or ex partner: Not on file    Emotionally abused: Not on file    Physically abused: Not on file    Forced sexual activity: Not on file  Other Topics Concern  . Not on file  Social History Narrative  . Not on file    FAMILY HISTORY: Family History  Problem Relation Age of Onset  . Heart  attack Father 75  . Stroke Mother   . Diabetes Sister   . Other Daughter        healthy  . Lung cancer Other        uncle  . Heart disease Other        paternal side    ALLERGIES:  has No Known Allergies.  MEDICATIONS:  Current Outpatient Medications  Medication Sig Dispense Refill  . lanreotide acetate (SOMATULINE DEPOT) 120 MG/0.5ML injection Inject 120 mg into the skin every 28 (twenty-eight) days.     No current facility-administered medications for this visit.     REVIEW OF SYSTEMS:   Constitutional: Denies fevers, chills or abnormal night sweats.  Occasional flushing present. Eyes: Denies blurriness of vision, double vision or watery eyes Ears, nose, mouth, throat, and face: Denies mucositis or sore throat Respiratory: Denies cough, dyspnea or wheezes Cardiovascular: Denies palpitation, chest discomfort or lower extremity swelling Gastrointestinal:  Denies nausea, heartburn or change in bowel habits.  Occasional pain in the right upper quadrant on certain movements.  Occasional diarrhea depending on what he eats. Skin: Denies abnormal skin rashes Lymphatics: Denies new lymphadenopathy or easy bruising Neurological:Denies numbness, tingling or new weaknesses Behavioral/Psych: Mood is stable, no new changes  All other systems were reviewed with the patient and are negative.  PHYSICAL EXAMINATION: ECOG PERFORMANCE STATUS: 0 - Asymptomatic  Vitals:   07/05/17 1519  BP: 122/77  Pulse: 74  Resp: 16  SpO2: 97%   Filed Weights   07/05/17 1519  Weight: 160 lb 8 oz (72.8 kg)    GENERAL:alert, no distress and comfortable SKIN: skin color, texture, turgor are normal, no rashes or significant lesions EYES: normal, conjunctiva are pink and non-injected, sclera clear OROPHARYNX:no exudate, no erythema and lips, buccal mucosa, and tongue normal  NECK: supple, thyroid normal size, non-tender, without nodularity LYMPH:  no palpable lymphadenopathy in the cervical, axillary  or inguinal LUNGS: clear to auscultation and percussion with normal breathing effort HEART: regular rate & rhythm and no murmurs and no lower extremity edema ABDOMEN: Border of the liver felt on deep inspiration.  No palpable splenomegaly. Musculoskeletal:no cyanosis of digits and no clubbing  PSYCH: alert & oriented x 3 with fluent speech NEURO: no focal motor/sensory deficits  LABORATORY DATA:  I have reviewed the data as listed Lab Results  Component Value Date   WBC 9.7 06/07/2017   HGB 12.4 (L) 06/07/2017   HCT 37.5 (L) 06/07/2017   MCV 91.7 06/07/2017   PLT 372 06/07/2017     Chemistry      Component Value Date/Time   NA  137 06/07/2017 1702   K 3.8 06/07/2017 1702   CL 97 (L) 06/07/2017 1702   CO2 26 06/07/2017 1702   BUN 11 06/07/2017 1702   CREATININE 1.04 06/07/2017 1702      Component Value Date/Time   CALCIUM 8.6 (L) 06/07/2017 1702   ALKPHOS 371 (H) 06/07/2017 1702   AST 40 06/07/2017 1702   ALT 30 06/07/2017 1702   BILITOT 2.2 (H) 06/07/2017 1702       RADIOGRAPHIC STUDIES: I have personally reviewed the radiological images as listed and agreed with the findings in the report. Dg Chest 2 View  Result Date: 06/07/2017 CLINICAL DATA:  62 y/o M; fever, chills, night sweats, right-sided chest pain, for 2 weeks. History of liver cancer and carcinoid tumor of the ileum and colon. EXAM: CHEST - 2 VIEW COMPARISON:  None. FINDINGS: The heart size and mediastinal contours are within normal limits. Both lungs are clear. The visualized skeletal structures are unremarkable. IMPRESSION: No active cardiopulmonary disease. Electronically Signed   By: Kristine Garbe M.D.   On: 06/07/2017 17:05   Ct Abdomen W Contrast  Result Date: 07/04/2017 CLINICAL DATA:  Follow-up of carcinoid tumor of the liver diagnosed 2/17 with colon primary. Hemicolectomy in 2013. EXAM: CT ABDOMEN WITH CONTRAST TECHNIQUE: Multidetector CT imaging of the abdomen was performed using the  standard protocol following bolus administration of intravenous contrast. CONTRAST:  126mL ISOVUE-300 IOPAMIDOL (ISOVUE-300) INJECTION 61% COMPARISON:  10/29/2014. FINDINGS: Lower chest: Bibasilar atelectasis. Normal heart size without pericardial or pleural effusion. Hepatobiliary: Bilateral liver masses. Dominant lesion which involves the anterior right and medial left hepatic lobes demonstrates heterogeneous arterial and delayed enhancement. Measures 10.8 x 8.1 cm on image 25/3. New since 10/29/2014. High left hepatic lobe lesions including in segment 4A at 2.0 cm on image 15/4 and 1.9 cm on image 13/4. Segment 4B smaller lesions versus areas of focal steatosis, including on image 44/3. Segment 3 subcentimeter lesion is suspected on image 44/3. Normal gallbladder, without biliary ductal dilatation. Pancreas: Normal, without mass or ductal dilatation. Spleen: Normal in size, without focal abnormality. Adrenals/Urinary Tract: Normal adrenal glands. Subcentimeter polar right renal low-density lesion is likely a cyst. Normal left kidney, without hydronephrosis. Stomach/Bowel: Gastric antral underdistention. Partial right hemicolectomy. Normal small bowel. Vascular/Lymphatic: Aortic and branch vessel atherosclerosis. No retroperitoneal or retrocrural adenopathy. Other: No ascites.  No evidence of omental or peritoneal disease. Musculoskeletal: No acute osseous abnormality. IMPRESSION: 1. Bilateral hepatic metastasis, new since 2016. 2. Right hemicolectomy, without locally recurrent disease. 3.  Aortic Atherosclerosis (ICD10-I70.0). Electronically Signed   By: Abigail Miyamoto M.D.   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 -Diagnosed with metastatic disease in the liver on 03/17/2017, started on  lanreotide injections on 04/11/2017 with improvement in right upper quadrant pain. -He has diarrhea on and off based on what he eats.  He is continuing to work full-time job. -If there is progression, adding everolimus will be an option.  Peptide receptor radioligand therapy (PRRT) is also an option following progression on the somatostatin analogue. - CT scan of the abdomen showed 10.8 x 8.1 cm dominant mass in the right lobe of the liver.  There is a hiatal left hepatic segment 4A lesion measuring 2 cm.  However this report was not compared to prior scan from  February done in [place].  I have requested radiologist to issue a new report.  We will pursue further options if there is clear-cut progression.  Serum chromogranin is in the 90s.  It was in the 120s prior to start of lanreotide.  24-hour urine 5-HIAA is within normal limits. -We will see him back in 3 months with repeat CT scan of the abdomen and repeat blood work.  Orders Placed This Encounter  Procedures  . CT Abdomen W Contrast    Standing Status:   Future    Standing Expiration Date:   07/05/2018    Order Specific Question:   If indicated for the ordered procedure, I authorize the administration of contrast media per Radiology protocol    Answer:   Yes    Order Specific Question:   Preferred imaging location?    Answer:   [facility]    Order Specific Question:   Is Oral Contrast requested for this exam?    Answer:   Yes, Per Radiology protocol    Order Specific Question:   Radiology Contrast Protocol - do NOT remove file path    Answer:   [email or web address]    Order Specific Question:   Reason for Exam additional comments    Answer:   metastatic neuroendocrine tumor with liver lesions; restaging  . CBC with Differential/Platelet    Standing Status:   Future    Standing Expiration Date:   07/05/2018  . Chromogranin A    Standing Status:   Future    Standing Expiration Date:   07/06/2018  .  Comprehensive metabolic panel    Standing Status:   Future    Standing Expiration Date:   07/05/2018    Order Specific Question:   Has the patient fasted?    Answer:   No    All questions were answered. The patient knows to call the clinic with any problems, questions or concerns.    This note includes documentation from Mike Craze, NP, who was present during this patient's office visit and evaluation.  I have reviewed this note for its completeness and accuracy.  I have edited this note accordingly based on my findings and medical opinion.      Derek Jack, MD 07/05/2017 5:51 PM

## 2017-07-05 NOTE — Assessment & Plan Note (Signed)
1.  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 -Diagnosed with metastatic disease in the liver on 03/17/2017, started on lanreotide injections on 04/11/2017 with improvement in right upper quadrant pain. -He has diarrhea on and off based on what he eats.  He is continuing to work full-time job. -If there is progression, adding everolimus will be an option.  Peptide receptor radioligand therapy (PRRT) is also an option following progression on the somatostatin analogue. - CT scan of the abdomen showed 10.8 x 8.1 cm dominant mass in the right lobe of the liver.  There is a hiatal left hepatic segment 4A lesion measuring 2 cm.  However this report was not compared to prior scan from February done in [place].  I have requested radiologist to issue a new report.  We will pursue further options if there is clear-cut progression.  Serum chromogranin is in the 90s.  It was in the 120s prior to start of lanreotide.  24-hour urine 5-HIAA is within normal limits. -We will see him back in 3 months with repeat CT scan of the abdomen and repeat blood work.

## 2017-07-10 ENCOUNTER — Encounter (HOSPITAL_COMMUNITY): Payer: Self-pay

## 2017-07-10 ENCOUNTER — Inpatient Hospital Stay (HOSPITAL_COMMUNITY): Payer: Managed Care, Other (non HMO) | Attending: Internal Medicine

## 2017-07-10 VITALS — BP 138/70 | HR 82 | Temp 98.5°F | Resp 18

## 2017-07-10 DIAGNOSIS — C7A012 Malignant carcinoid tumor of the ileum: Secondary | ICD-10-CM | POA: Diagnosis not present

## 2017-07-10 DIAGNOSIS — D3A012 Benign carcinoid tumor of the ileum: Secondary | ICD-10-CM

## 2017-07-10 MED ORDER — LANREOTIDE ACETATE 120 MG/0.5ML ~~LOC~~ SOLN
120.0000 mg | Freq: Once | SUBCUTANEOUS | Status: AC
  Administered 2017-07-10: 120 mg via SUBCUTANEOUS
  Filled 2017-07-10: qty 120

## 2017-07-10 NOTE — Patient Instructions (Signed)
[facility] at [facility] Discharge Instructions  Received Lanreotide injection today. Follow-up as scheduled. Call clinic for any questions or concerns   Thank you for choosing [facility] at [facility] to provide your oncology and hematology care.  To afford each patient quality time with our provider, please arrive at least 15 minutes before your scheduled appointment time.   If you have a lab appointment with the Cancer Center please come in thru the  Main Entrance and check in at the main information desk  You need to re-schedule your appointment should you arrive 10 or more minutes late.  We strive to give you quality time with our providers, and arriving late affects you and other patients whose appointments are after yours.  Also, if you no show three or more times for appointments you may be dismissed from the clinic at the providers discretion.     Again, thank you for choosing [facility].  Our hope is that these requests will decrease the amount of time that you wait before being seen by our physicians.       _____________________________________________________________  Should you have questions after your visit to [facility], please contact our office at [phone] between the hours of 8:30 a.m. and 4:30 p.m.  Voicemails left after 4:30 p.m. will not be returned until the following business day.  For prescription refill requests, have your pharmacy contact our office.       Resources For Cancer Patients and their Caregivers ? American Cancer Society: Can assist with transportation, wigs, general needs, runs Look Good Feel Better.        1-888-227-6333 ? Cancer Care: Provides financial assistance, online support groups, medication/co-pay assistance.  1-800-813-HOPE (4673) ? [facility] Assists [place] cancer patients and their families through emotional , educational and  financial support.  336-427-4357 ? [place] DSS Where to apply for food stamps, Medicaid and utility assistance. 336-342-1394 ? RCATS: Transportation to medical appointments. 336-347-2287 ? Social Security Administration: May apply for disability if have a Stage IV cancer. 336-342-7796 1-800-772-1213 ? [place] Aging, Disability and Transit Services: Assists with nutrition, care and transit needs. 336-349-2343  Cancer Center Support Programs:   > Cancer Support Group  2nd Tuesday of the month 1pm-2pm, Journey Room   > Creative Journey  3rd Tuesday of the month 1130am-1pm, Journey Room    

## 2017-07-10 NOTE — Progress Notes (Signed)
Michael Wood tolerated Lanreotide injection well without complaints or incident.VSS Pt discharged self ambulatory in satisfactory condition 

## 2017-08-07 ENCOUNTER — Encounter (HOSPITAL_COMMUNITY): Payer: Self-pay

## 2017-08-07 ENCOUNTER — Inpatient Hospital Stay (HOSPITAL_COMMUNITY): Payer: Managed Care, Other (non HMO) | Attending: Hematology

## 2017-08-07 VITALS — BP 132/73 | HR 81 | Temp 98.3°F | Resp 18

## 2017-08-07 DIAGNOSIS — C7B02 Secondary carcinoid tumors of liver: Secondary | ICD-10-CM | POA: Insufficient documentation

## 2017-08-07 DIAGNOSIS — C7A012 Malignant carcinoid tumor of the ileum: Secondary | ICD-10-CM | POA: Insufficient documentation

## 2017-08-07 DIAGNOSIS — D3A012 Benign carcinoid tumor of the ileum: Secondary | ICD-10-CM

## 2017-08-07 MED ORDER — LANREOTIDE ACETATE 120 MG/0.5ML ~~LOC~~ SOLN
120.0000 mg | Freq: Once | SUBCUTANEOUS | Status: AC
  Administered 2017-08-07: 120 mg via SUBCUTANEOUS
  Filled 2017-08-07: qty 120

## 2017-08-07 NOTE — Progress Notes (Signed)
Michael Wood tolerated Lanreotide injection well without complaints or incident.VSS Pt discharged self ambulatory in satisfactory condition

## 2017-08-07 NOTE — Patient Instructions (Signed)
[place] at [facility] Discharge Instructions  Received Lanreotide injection today. Follow-up as scheduled. Call clinic for any questions or concerns   Thank you for choosing [place] at [facility] to provide your oncology and hematology care.  To afford each patient quality time with our provider, please arrive at least 15 minutes before your scheduled appointment time.   If you have a lab appointment with the [place] please come in thru the  Main Entrance and check in at the main information desk  You need to re-schedule your appointment should you arrive 10 or more minutes late.  We strive to give you quality time with our providers, and arriving late affects you and other patients whose appointments are after yours.  Also, if you no show three or more times for appointments you may be dismissed from the clinic at the providers discretion.     Again, thank you for choosing [facility].  Our hope is that these requests will decrease the amount of time that you wait before being seen by our physicians.       _____________________________________________________________  Should you have questions after your visit to [facility], please contact our office at [phone] between the hours of 8:30 a.m. and 4:30 p.m.  Voicemails left after 4:30 p.m. will not be returned until the following business day.  For prescription refill requests, have your pharmacy contact our office.       Resources For Cancer Patients and their Caregivers ? American Cancer Society: Can assist with transportation, wigs, general needs, runs Look Good Feel Better.        806-445-9767 ? Cancer Care: Provides financial assistance, online support groups, medication/co-pay assistance.  1-800-813-HOPE 772-447-3727) ? [place] Assists [place] cancer patients and their families through emotional , educational and  financial support.  (603) 394-9480 ? [place] DSS Where to apply for food stamps, Medicaid and utility assistance. 860-614-4223 ? RCATS: Transportation to medical appointments. 848-668-3607 ? Social Security Administration: May apply for disability if have a Stage IV cancer. 715-354-3931 7181966418 ? [employer], Disability and Transit Services: Assists with nutrition, care and transit needs. [place] Support Programs:   > Cancer Support Group  2nd Tuesday of the month 1pm-2pm, Journey Room   > Creative Journey  3rd Tuesday of the month 1130am-1pm, Journey Room

## 2017-09-04 ENCOUNTER — Inpatient Hospital Stay (HOSPITAL_COMMUNITY): Payer: Managed Care, Other (non HMO) | Attending: Hematology

## 2017-09-04 ENCOUNTER — Other Ambulatory Visit (HOSPITAL_COMMUNITY): Payer: Managed Care, Other (non HMO)

## 2017-09-04 ENCOUNTER — Inpatient Hospital Stay (HOSPITAL_COMMUNITY): Payer: Managed Care, Other (non HMO)

## 2017-09-04 ENCOUNTER — Ambulatory Visit (HOSPITAL_COMMUNITY): Payer: Managed Care, Other (non HMO)

## 2017-09-04 DIAGNOSIS — C7B02 Secondary carcinoid tumors of liver: Secondary | ICD-10-CM | POA: Diagnosis present

## 2017-09-04 DIAGNOSIS — C7A012 Malignant carcinoid tumor of the ileum: Secondary | ICD-10-CM | POA: Insufficient documentation

## 2017-09-04 LAB — CBC WITH DIFFERENTIAL/PLATELET
BASOS ABS: 0.1 10*3/uL (ref 0.0–0.1)
Basophils Relative: 1 %
Eosinophils Absolute: 0.2 10*3/uL (ref 0.0–0.7)
Eosinophils Relative: 2 %
HEMATOCRIT: 38.9 % — AB (ref 39.0–52.0)
Hemoglobin: 12.3 g/dL — ABNORMAL LOW (ref 13.0–17.0)
LYMPHS PCT: 14 %
Lymphs Abs: 1.3 10*3/uL (ref 0.7–4.0)
MCH: 29.6 pg (ref 26.0–34.0)
MCHC: 31.6 g/dL (ref 30.0–36.0)
MCV: 93.7 fL (ref 78.0–100.0)
MONO ABS: 1.2 10*3/uL — AB (ref 0.1–1.0)
MONOS PCT: 13 %
NEUTROS ABS: 6.8 10*3/uL (ref 1.7–7.7)
Neutrophils Relative %: 70 %
PLATELETS: 368 10*3/uL (ref 150–400)
RBC: 4.15 MIL/uL — ABNORMAL LOW (ref 4.22–5.81)
RDW: 14.9 % (ref 11.5–15.5)
WBC: 9.6 10*3/uL (ref 4.0–10.5)

## 2017-09-04 LAB — COMPREHENSIVE METABOLIC PANEL
ALK PHOS: 249 U/L — AB (ref 38–126)
ALT: 12 U/L (ref 0–44)
AST: 22 U/L (ref 15–41)
Albumin: 3.3 g/dL — ABNORMAL LOW (ref 3.5–5.0)
Anion gap: 8 (ref 5–15)
BUN: 16 mg/dL (ref 6–20)
CALCIUM: 9.2 mg/dL (ref 8.9–10.3)
CHLORIDE: 101 mmol/L (ref 98–111)
CO2: 29 mmol/L (ref 22–32)
Creatinine, Ser: 1.15 mg/dL (ref 0.61–1.24)
GFR calc Af Amer: >60 mL/min (ref >60)
GFR calc non Af Amer: >60 mL/min (ref >60)
Glucose, Bld: 116 mg/dL — ABNORMAL HIGH (ref 70–99)
Potassium: 4.8 mmol/L (ref 3.5–5.1)
Sodium: 138 mmol/L (ref 135–145)
Total Bilirubin: 0.8 mg/dL (ref 0.3–1.2)
Total Protein: 7.3 g/dL (ref 6.5–8.1)

## 2017-09-05 ENCOUNTER — Inpatient Hospital Stay (HOSPITAL_COMMUNITY): Payer: Managed Care, Other (non HMO)

## 2017-09-05 ENCOUNTER — Encounter (HOSPITAL_COMMUNITY): Payer: Self-pay

## 2017-09-05 VITALS — BP 115/69 | HR 71 | Temp 97.7°F | Resp 18 | Wt 156.5 lb

## 2017-09-05 DIAGNOSIS — C7A012 Malignant carcinoid tumor of the ileum: Secondary | ICD-10-CM | POA: Diagnosis not present

## 2017-09-05 DIAGNOSIS — D3A012 Benign carcinoid tumor of the ileum: Secondary | ICD-10-CM

## 2017-09-05 MED ORDER — LANREOTIDE ACETATE 120 MG/0.5ML ~~LOC~~ SOLN
120.0000 mg | Freq: Once | SUBCUTANEOUS | Status: AC
  Administered 2017-09-05: 120 mg via SUBCUTANEOUS
  Filled 2017-09-05: qty 120

## 2017-09-05 NOTE — Patient Instructions (Signed)
[place] at [facility]  Discharge Instructions:  You received lanreotide today.  _______________________________________________________________  Thank you for choosing [place] at [facility] to provide your oncology and hematology care.  To afford each patient quality time with our providers, please arrive at least 15 minutes before your scheduled appointment.  You need to re-schedule your appointment if you arrive 10 or more minutes late.  We strive to give you quality time with our providers, and arriving late affects you and other patients whose appointments are after yours.  Also, if you no show three or more times for appointments you may be dismissed from the clinic.  Again, thank you for choosing [place] at [place] hope is that these requests will allow you access to exceptional care and in a timely manner. _______________________________________________________________  If you have questions after your visit, please contact our office at [phone] between the hours of 8:30 a.m. and 5:00 p.m. Voicemails left after 4:30 p.m. will not be returned until the following business day. _______________________________________________________________  For prescription refill requests, have your pharmacy contact our office. _______________________________________________________________  Recommendations made by the consultant and any test results will be sent to your referring physician. _______________________________________________________________

## 2017-09-05 NOTE — Progress Notes (Signed)
Patient tolerated injection with no complaints voiced.  Site clean and dry with no bruising or swelling noted at site.  Band aid applied.  VSS with discharge and left ambulatory with no s/s of distress noted.    

## 2017-09-06 LAB — CHROMOGRANIN A: CHROMOGRANIN A: 132 nmol/L — AB (ref 0–5)

## 2017-10-02 ENCOUNTER — Ambulatory Visit (HOSPITAL_COMMUNITY): Payer: Managed Care, Other (non HMO)

## 2017-10-02 ENCOUNTER — Ambulatory Visit (HOSPITAL_COMMUNITY)
Admission: RE | Admit: 2017-10-02 | Discharge: 2017-10-02 | Disposition: A | Discharge status: Home / Self Care | Payer: Managed Care, Other (non HMO) | Source: Ambulatory Visit | Attending: Hematology | Admitting: Hematology

## 2017-10-02 ENCOUNTER — Ambulatory Visit (HOSPITAL_COMMUNITY): Payer: Managed Care, Other (non HMO) | Admitting: Hematology

## 2017-10-02 DIAGNOSIS — R599 Enlarged lymph nodes, unspecified: Secondary | ICD-10-CM | POA: Diagnosis not present

## 2017-10-02 DIAGNOSIS — C7B02 Secondary carcinoid tumors of liver: Secondary | ICD-10-CM | POA: Diagnosis present

## 2017-10-02 DIAGNOSIS — I700 Atherosclerosis of aorta: Secondary | ICD-10-CM | POA: Diagnosis not present

## 2017-10-02 MED ORDER — IOPAMIDOL (ISOVUE-300) INJECTION 61%
100.0000 mL | Freq: Once | INTRAVENOUS | Status: AC | PRN
  Administered 2017-10-02: 100 mL via INTRAVENOUS

## 2017-10-04 ENCOUNTER — Other Ambulatory Visit: Payer: Self-pay

## 2017-10-04 ENCOUNTER — Inpatient Hospital Stay (HOSPITAL_COMMUNITY): Payer: Managed Care, Other (non HMO)

## 2017-10-04 ENCOUNTER — Inpatient Hospital Stay (HOSPITAL_COMMUNITY): Payer: Managed Care, Other (non HMO) | Attending: Hematology | Admitting: Hematology

## 2017-10-04 ENCOUNTER — Encounter (HOSPITAL_COMMUNITY): Payer: Self-pay | Admitting: Hematology

## 2017-10-04 VITALS — BP 118/75 | HR 63 | Temp 98.4°F | Resp 16 | Wt 160.9 lb

## 2017-10-04 DIAGNOSIS — D3A012 Benign carcinoid tumor of the ileum: Secondary | ICD-10-CM

## 2017-10-04 DIAGNOSIS — C7A029 Malignant carcinoid tumor of the large intestine, unspecified portion: Secondary | ICD-10-CM | POA: Insufficient documentation

## 2017-10-04 DIAGNOSIS — R197 Diarrhea, unspecified: Secondary | ICD-10-CM | POA: Insufficient documentation

## 2017-10-04 DIAGNOSIS — C7B02 Secondary carcinoid tumors of liver: Secondary | ICD-10-CM

## 2017-10-04 MED ORDER — EVEROLIMUS 5 MG PO TABS: 10.0000 mg | ORAL_TABLET | Freq: Every day | ORAL | 0 refills | Status: DC

## 2017-10-04 MED ORDER — LANREOTIDE ACETATE 120 MG/0.5ML ~~LOC~~ SOLN
120.0000 mg | Freq: Once | SUBCUTANEOUS | Status: AC
  Administered 2017-10-04: 120 mg via SUBCUTANEOUS
  Filled 2017-10-04: qty 120

## 2017-10-04 NOTE — Progress Notes (Signed)
Michael Wood presents today for injection per the provider's orders.  Lanreotide administration without incident; see MAR for injection details.  Patient tolerated procedure well and without incident.  No questions or complaints noted at this time.  Discharged ambulatory in c/o spouse.

## 2017-10-04 NOTE — Patient Instructions (Signed)
[place] at [facility] Discharge Instructions  Follow up in 4 weeks with labs the same day. Let us know when you receive the new pills so we can see you back 2 weeks after you start them. Start on 5 mg at first until we see you back.    Thank you for choosing [place] at [facility] to provide your oncology and hematology care.  To afford each patient quality time with our provider, please arrive at least 15 minutes before your scheduled appointment time.   If you have a lab appointment with the [place] please come in thru the  Main Entrance and check in at the main information desk  You need to re-schedule your appointment should you arrive 10 or more minutes late.  We strive to give you quality time with our providers, and arriving late affects you and other patients whose appointments are after yours.  Also, if you no show three or more times for appointments you may be dismissed from the clinic at the providers discretion.     Again, thank you for choosing [facility].  Our hope is that these requests will decrease the amount of time that you wait before being seen by our physicians.       _____________________________________________________________  Should you have questions after your visit to [facility], please contact our office at [phone] between the hours of 8:00 a.m. and 4:30 p.m.  Voicemails left after 4:00 p.m. will not be returned until the following business day.  For prescription refill requests, have your pharmacy contact our office and allow 72 hours.    Cancer Center Support Programs:   > Cancer Support Group  2nd Tuesday of the month 1pm-2pm, Journey Room

## 2017-10-04 NOTE — Progress Notes (Signed)
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  He was treated with Sandostatin LAR monthly injections from 12/11/2011 through December 2016, by Dr. 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 male returns for routine follow-up for metastatic carcinoid tumor to the liver. Patient is here today with his wife. Patient has had pain in left upper quadrant. Slight pain in the right upper quadrant, mostly left. The pain is worse closer it gets to his next injection. He experience fatigue throughout the day. He has been out of work since July 31. Patient denies any bleeding or blood in your stool. Denies any nausea or vomiting. Denies any headaches or vision changes. He reports his appetite and energy level at 75%.     REVIEW  OF SYSTEMS:  Review of Systems  Constitutional: Positive for fatigue.  All other systems reviewed and are negative.    PAST MEDICAL/SURGICAL HISTORY:  Past Medical History:  Diagnosis Date  . Carcinoid tumor of colon   . Carcinoid tumor of ileum    ileocecal valve  . Esophageal reflux    Past Surgical History:  Procedure Laterality Date  . HEMICOLECTOMY Right 2013  . INGUINAL HERNIA REPAIR       SOCIAL HISTORY:  Social History   Socioeconomic History  . Marital status: Single    Spouse name: Not on file  . Number of children: Not on file  . Years of education: Not on file  . Highest education level: Not on file  Occupational History  . Not on file  Social Needs  . Financial resource strain: Not on file  . Food insecurity:    Worry: Not on file    Inability: Not on file  . Transportation needs:    Medical: Not on file    Non-medical: Not on file  Tobacco Use  . Smoking status: Former Smoker    Packs/day: 1.00    Years: 35.00    Pack years: 35.00    Types: Cigarettes    Last attempt to quit: 02/14/2008    Years since quitting: 9.6  . Smokeless tobacco: Never Used  Substance and Sexual Activity  . Alcohol use: Never    Frequency: Never  . Drug use: Never  . Sexual activity: Not on file  Lifestyle  . Physical activity:  Days per week: Not on file    Minutes per session: Not on file  . Stress: Not on file  Relationships  . Social connections:    Talks on phone: Not on file    Gets together: Not on file    Attends religious service: Not on file    Active member of club or organization: Not on file    Attends meetings of clubs or organizations: Not on file    Relationship status: Not on file  . Intimate partner violence:    Fear of current or ex partner: Not on file    Emotionally abused: Not on file    Physically abused: Not on file    Forced sexual activity: Not on file  Other Topics Concern  . Not on file  Social History Narrative  . Not on file     FAMILY HISTORY:  Family History  Problem Relation Age of Onset  . Heart attack Father 71  . Stroke Mother   . Diabetes Sister   . Other Daughter        healthy  . Lung cancer Other        uncle  . Heart disease Other        paternal side    CURRENT MEDICATIONS:  Outpatient Encounter Medications as of 10/04/2017  Medication Sig  . lanreotide acetate (SOMATULINE DEPOT) 120 MG/0.5ML injection Inject 120 mg into the skin every 28 (twenty-eight) days.  Marland Kitchen everolimus (AFINITOR) 5 MG tablet Take 2 tablets (10 mg total) by mouth daily.  . [EXPIRED] lanreotide acetate (SOMATULINE DEPOT) injection 120 mg    No facility-administered encounter medications on file as of 10/04/2017.     ALLERGIES:  No Known Allergies   PHYSICAL EXAM:  ECOG Performance status: 1  Vitals:   10/04/17 1433  BP: 118/75  Pulse: 63  Resp: 16  Temp: 98.4 F (36.9 C)  SpO2: 99%   Filed Weights   10/04/17 1433  Weight: 160 lb 14.4 oz (73 kg)    Physical Exam  Constitutional: He is oriented to person, place, and time. He appears well-developed and well-nourished.  Abdominal: Soft.  Neurological: He is alert and oriented to person, place, and time.  Skin: Skin is warm and dry.     LABORATORY DATA:  I have reviewed the labs as listed.  CBC    Component Value Date/Time   WBC 9.6 09/04/2017 1508   RBC 4.15 (L) 09/04/2017 1508   HGB 12.3 (L) 09/04/2017 1508   HCT 38.9 (L) 09/04/2017 1508   PLT 368 09/04/2017 1508   MCV 93.7 09/04/2017 1508   MCH 29.6 09/04/2017 1508   MCHC 31.6 09/04/2017 1508   RDW 14.9 09/04/2017 1508   LYMPHSABS 1.3 09/04/2017 1508   MONOABS 1.2 (H) 09/04/2017 1508   EOSABS 0.2 09/04/2017 1508   BASOSABS 0.1 09/04/2017 1508   CMP Latest Ref Rng & Units 09/04/2017 06/07/2017  Glucose 70 - 99 mg/dL 116(H) 134(H)  BUN 6 - 20 mg/dL 16 11  Creatinine 0.61 - 1.24 mg/dL 1.15 1.04  Sodium 135 - 145 mmol/L 138 137  Potassium 3.5 - 5.1 mmol/L 4.8 3.8  Chloride 98 - 111  mmol/L 101 97(L)  CO2 22 - 32 mmol/L 29 26  Calcium 8.9 - 10.3 mg/dL 9.2 8.6(L)  Total Protein 6.5 - 8.1 g/dL 7.3 7.2  Total Bilirubin 0.3 - 1.2 mg/dL 0.8 2.2(H)  Alkaline Phos 38 - 126 U/L 249(H) 371(H)  AST 15 - 41 U/L 22  40  ALT 0 - 44 U/L 12 30       DIAGNOSTIC IMAGING:  I have independently reviewed images of the CT scan dated 10/02/2017 and discussed with the patient.     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 -Diagnosed with metastatic disease in the liver on 03/17/2017, started on lanreotide injections on 04/11/2017 with improvement in right upper quadrant pain. -He has diarrhea on and off based on what he eats.  He is continuing to work full-time job. -If there is progression, adding everolimus will be an option.  Peptide receptor radioligand therapy (PRRT) is also an option following progression on the somatostatin analogue. - He is tolerating lanreotide injections very well.  He does occasionally get pain in bilateral upper quadrants, mostly on the right side. -I discussed and reviewed the images of the CT scan dated 10/02/2017 which showed mild progression of hepatic metastasis, by few millimeters.  Portal caval adenopathy has also grown by few millimeters.  Hence I have recommended adding everolimus to lanreotide.  We will start at 5 mg daily and see him back in 2 weeks.  If he is tolerating it well I will increase it to 10 mg daily.  We talked about side effects including but not limited to mucositis, cytopenias, diarrhea, hand-foot skin reaction among others.  He understands and gives [place] permission to proceed with the treatment.      Orders placed this encounter:  Orders Placed This Encounter  Procedures  . CBC with Differential/Platelet  . Comprehensive metabolic panel   . Chromogranin A      Derek Jack, MD [place] [phone]

## 2017-10-04 NOTE — Assessment & Plan Note (Signed)
1.  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 -Diagnosed with metastatic disease in the liver on 03/17/2017, started on lanreotide injections on 04/11/2017 with improvement in right upper quadrant pain. -He has diarrhea on and off based on what he eats.  He is continuing to work full-time job. -If there is progression, adding everolimus will be an option.  Peptide receptor radioligand therapy (PRRT) is also an option following progression on the somatostatin analogue. - He is tolerating lanreotide injections very well.  He does occasionally get pain in bilateral upper quadrants, mostly on the right side. -I discussed and reviewed the images of the CT scan dated 10/02/2017 which showed mild progression of hepatic metastasis, by few millimeters.  Portal caval adenopathy has also grown by few millimeters.  Hence I have recommended adding everolimus to lanreotide.  We will start at 5 mg daily and see him back in 2 weeks.  If he is tolerating it well I will increase it to 10 mg daily.  We talked about side effects including but not limited to mucositis, cytopenias, diarrhea, hand-foot skin reaction among others.  He understands and gives [place] permission to proceed with the treatment.

## 2017-10-05 ENCOUNTER — Telehealth (HOSPITAL_COMMUNITY): Payer: Self-pay | Admitting: Hematology

## 2017-10-05 ENCOUNTER — Inpatient Hospital Stay (HOSPITAL_COMMUNITY): Payer: Managed Care, Other (non HMO)

## 2017-10-05 NOTE — Progress Notes (Signed)
Chemotherapy teaching pulled together on Afinitor. Patient will be taught over the telephone.

## 2017-10-05 NOTE — Telephone Encounter (Signed)
FAXED AFINITOR SCRIPT TO [facility] ALSO ENROLLED PT IN [facility] CARD PROG

## 2017-10-05 NOTE — Patient Instructions (Signed)
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  We have given you Afinitor (everolimus).  You will take 2 tablets by mouth daily.  If you miss a dose do not worry just take the next dose at the correct time, do not take a double dose.  You will see the doctor regularly throughout treatment.  We monitor your lab work prior to every treatment. The doctor monitors your response to treatment by the way you are feeling, your blood work, and scans periodically.   Everolimus (Afinitor)  About This Drug Everolimus is used to treat cancer. It is given orally (by mouth).  Possible Side Effects . Soreness of the mouth and throat. You may have red areas, white patches, or sores that hurt. . Nausea . Pain in your abdomen . Diarrhea (loose bowel movements) . Swelling of your legs, ankles and/or feet . Infection . Tiredness and weakness . Fever . Decreased appetite (decreased hunger) . Headache . Cough . Upper respiratory infection and other infections . Rash  Note: Each of the side effects above was reported in 30% or greater of patients treated with everolimus. Not all possible side effects are included above.  Warnings and Precautions . Bone marrow suppression. This is a decrease in the number of white blood cells, red blood cells, and platelets. This may raise your risk of infection, make you tired and weak (fatigue), and raise your risk of bleeding. . Inflammation (swelling) of the lungs. You may have a dry cough or trouble breathing. . Severe infections, including viral, bacterial and fungal, which can be life-threatening . Risk of angioedema if you are also taking a type medication called ace inhibitor. Symptoms may be swelling of the face, feeling like your tongue or throat are swelling, and trouble breathing. . Severe allergic reactions, including anaphylaxis are rare but may happen in some patients.  Signs of allergic reaction to this drug may be swelling of the face, feeling like your tongue or throat are swelling, trouble breathing, rash, itching, fever, chills, feeling dizzy, and/or feeling that your heart is beating in a fast or not normal way. If this happens, do not take another dose of this drug. You should get urgent medical treatment. . Severe soreness of the mouth and throat . Changes in your kidney function which may cause kidney failure . Slow wound healing . Severe increase in your blood sugar, cholesterol and triglyceride levels  Note: Some of the side effects above are very rare. If you have concerns and/or questions, please discuss them with your medical team  Important Information . Everolimus may cause slow wound healing. It should not be given before surgery or if you have an open wound. If you must have emergency surgery or have an accident that results in a wound, tell the doctor that you are on everolimus. . Talk to your doctor before receiving any vaccinations during your treatment. Some vaccinations are not recommended while receiving everolimus. You should also avoid contact with people whom have received live vaccines. . Do not substitute the tablet for the oral suspension.  How to Take Your Medication . Take the medicine consistently with or without food at the same time each day. . Tablet: Swallow whole with water. Do not break or crush it. . Oral Suspension: Prepare suspension with water only. Talk to your doctor, nurse and/or pharmacist for proper preparation, dosing and administration. The oral suspension should not be prepared by someone  who is pregnant or planning to be become pregnant because it may cause harm to an unborn baby. . Missed dose: If you miss a dose, and it is within 6 hours of the missed dose, take the missed dose, otherwise, skip the missed dose and go back to your normal schedule. Do not take 2 doses at the same time or extra  doses. . If you vomit a dose, take your next dose at the regular time and contact your physician. Do not take 2 doses at the same time and do not double up on the next dose. Marland Kitchen Handling: Wash your hands after handling your medicine, your caretakers should not handle your medicine with bare hands and should wear latex gloves. . This drug may be present in the saliva, tears, sweat, urine, stool, vomit, semen, and vaginal secretions. Talk to your doctor and/or your nurse about the necessary precautions to take during this time. . Storage: Store this medicine in the original container at room temperature. Protect from light and moisture. . Disposal of unused medicine: Do not flush any expired and/or unused medicine down the toilet or drain unless you are specifically instructed to do so on the medication label. Some facilities have take-back programs and/or other options. If you do not have a take-back program in your area, then please discuss with your nurse or your doctor how to dispose of unused medicine.  Treating Side Effects . Manage tiredness by pacing your activities for the day. . Be sure to include periods of rest between energy-draining activities. . To decrease the risk of infection, wash your hands regularly. . Avoid close contact with people who have a cold, the flu, or other infections. . Take your temperature as your doctor or nurse tells you, and whenever you feel like you may have a fever. . To help decrease the risk of bleeding, use a soft toothbrush. Check with your nurse before using dental floss. . Be very careful when using knives or tools. . Use an electric shaver instead of a razor. Marland Kitchen Keeping your pain under control is important to your well-being. Please tell your doctor or nurse if you are experiencing pain. . Manage tiredness by pacing your activities for the day. Be sure to include periods of rest between energy-draining activities. . Drink plenty of fluids (a  minimum of eight glasses per day is recommended). . If you throw up or have loose bowel movements, you should drink more fluids so that you do not become dehydrated (lack of water in the body from losing too much fluid). . If you have diarrhea, eat low-fiber foods that are high in protein and calories and avoid foods that can irritate your digestive tracts or lead to cramping. . Ask your nurse or doctor about medicine that can lessen or stop your diarrhea. . To help with nausea and vomiting, eat small, frequent meals instead of three large meals a day. Choose foods and drinks that are at room temperature. Ask your nurse or doctor about other helpful tips and medicine that is available to help stop or lessen these symptoms. . Mouth care is very important. Your mouth care should consist of routine, gentle cleaning of your teeth or dentures and rinsing your mouth with a mixture of 1/2 teaspoon of salt in 8 ounces of water or 1/2 teaspoon of baking soda in 8 ounces of water. This should be done at least after each meal and at bedtime. . If you have mouth sores, avoid mouthwash that  has alcohol. Also avoid alcohol and smoking because they can bother your mouth and throat. . To help with decreased appetite, eat small, frequent meals. Eat foods high in calories and protein, such as meat, poultry, fish, dry beans, tofu, eggs, nuts, milk, yogurt, cheese, ice cream, pudding, and nutritional supplements. . Consider using sauces and spices to increase taste. Daily exercise, with your doctor's approval, may increase your appetite. . If you get a rash do not put anything on it unless your doctor or nurse says you may. Keep the area around the rash clean and dry. Ask your doctor for medicine if your rash bothers you. . If you have diabetes, keep good control of your blood sugar level. Tell your nurse or your doctor if your glucose levels are higher or lower than normal.  Food and Drug Interactions . Avoid  grapefruit or grapefruit juice while taking this medicine. Grapefruit and grapefruit juice may raise the levels of everolimus in your body. This could make side effects worse . Check with your doctor or pharmacist about all other prescription medicines and over-the-counter medicines and dietary supplements (vitamins, minerals, herbs and others) you are taking before starting this medicine as there are known drug interactions with everolimus. Also, check with your doctor or pharmacist before starting any new prescription or over-the-counter medicines, or dietary supplements to make sure that there are no interactions. . Avoid the use of St. John's Wort while taking everolimus as this may lower the levels of the drug in your body, which can make it less effective.  When to Call the Doctor Call your doctor or nurse if you have any of these symptoms and/or any new or unusual symptoms: . Fever of 100.4 F (38 C) or higher . Chills . Feeling dizzy or lightheaded . Headache that does not go away . Wheezing or trouble breathing . Coughing up yellow, green, or bloody mucus . Pain in your chest or abdomen . Dry cough . Symptoms of angioedema: swelling of the face, feeling like your tongue or throat are swelling, and trouble breathing . Signs of allergic reaction: swelling of the face, feeling like your tongue or throat are swelling, trouble breathing, rash, itching, fever, chills, feeling dizzy, and/or feeling that your heart is beating in a fast or not normal way. If this happens, call 911 for emergency care. . Severe soreness of the mouth and throat . Tiredness that interferes with your daily activities . Easy bleeding or bruising . Lasting loss of appetite or rapid weight loss of five pounds in a week . Nausea that stops you from eating or drinking and/or is not relieved by prescribed medicines . Throwing up more than 3 times a day . Pain in your mouth or throat that makes it hard to eat or  drink . Diarrhea, 4 times in one day or diarrhea with lack of strength or a feeling of being dizzy . Decreased urine or very dark urine . Abnormal blood sugar . Unusual thirst, passing urine often, headache, sweating, shakiness, irritability . New rash and/or itching . Rash that is not relieved by prescribed medicines . Weight gain of 5 pounds in one week (fluid retention) . Signs of possible liver problems: dark urine, pale bowel movements, bad stomach pain, feeling very tired and weak, unusual itching, or yellowing of the eyes or skin . If you think you may be pregnant or may have impregnated your partner  Reproduction Warnings . Pregnancy warning: This drug can have harmful effects on the  unborn baby. Women of childbearing potential should use effective methods of birth control during your cancer treatment and for 8 weeks after treatment. Men with male partners of childbearing potential should use effective methods of birth control during your cancer treatment and for 4 weeks after your cancer treatment. Let your doctor know right away if you think you may be pregnant or may have impregnated your partner. . Breastfeeding warning: Women should not breastfeed during treatment and for 2 weeks after treatment because this drug could enter the breast milk and cause harm to a breastfeeding baby. . Fertility warning: In men and women both, this drug may affect your ability to have children in the future. Talk with your doctor or nurse if you plan to have children. Ask for information on sperm or egg banking.   SELF CARE ACTIVITIES WHILE ON CHEMOTHERAPY:  Hydration Increase your fluid intake 48 hours prior to treatment and drink at least 8 to 12 cups (64 ounces) of water/decaffeinated beverages per day after treatment. You can still have your cup of coffee or soda but these beverages do not count as part of your 8 to 12 cups that you need to drink daily. No alcohol  intake.  Medications Continue taking your normal prescription medication as prescribed.  If you start any new herbal or new supplements please let us know first to make sure it is safe.  Mouth Care Have teeth cleaned professionally before starting treatment. Keep dentures and partial plates clean. Use soft toothbrush and do not use mouthwashes that contain alcohol. Biotene is a good mouthwash that is available at most pharmacies or may be ordered by calling [phone]. Use warm salt water gargles (1 teaspoon salt per 1 quart warm water) before and after meals and at bedtime. Or you may rinse with 2 tablespoons of three-percent hydrogen peroxide mixed in eight ounces of water. If you are still having problems with your mouth or sores in your mouth please call the clinic. If you need dental work, please let the doctor know before you go for your appointment so that we can coordinate the best possible time for you in regards to your chemo regimen. You need to also let your dentist know that you are actively taking chemo. We may need to do labs prior to your dental appointment.  Skin Care Always use sunscreen that has not expired and with SPF (Sun Protection Factor) of 50 or higher. Wear hats to protect your head from the sun. Remember to use sunscreen on your hands, ears, face, & feet.  Use good moisturizing lotions such as udder cream, eucerin, or even Vaseline. Some chemotherapies can cause dry skin, color changes in your skin and nails.    . Avoid long, hot showers or baths. . Use gentle, fragrance-free soaps and laundry detergent. . Use moisturizers, preferably creams or ointments rather than lotions because the thicker consistency is better at preventing skin dehydration. Apply the cream or ointment within 15 minutes of showering. Reapply moisturizer at night, and moisturize your hands every time after you wash them.  Hair Loss (if your doctor says your hair will fall out)  . If your doctor  says that your hair is likely to fall out, decide before you begin chemo whether you want to wear a wig. You may want to shop before treatment to match your hair color. . Hats, turbans, and scarves can also camouflage hair loss, although some people prefer to leave their heads uncovered. If you go bare-headed  outdoors, be sure to use sunscreen on your scalp. . Cut your hair short. It eases the inconvenience of shedding lots of hair, but it also can reduce the emotional impact of watching your hair fall out. . Don't perm or color your hair during chemotherapy. Those chemical treatments are already damaging to hair and can enhance hair loss. Once your chemo treatments are done and your hair has grown back, it's OK to resume dyeing or perming hair. With chemotherapy, hair loss is almost always temporary. But when it grows back, it may be a different color or texture. In older adults who still had hair color before chemotherapy, the new growth may be completely gray.  Often, new hair is very fine and soft.  Infection Prevention Please wash your hands for at least 30 seconds using warm soapy water. Handwashing is the #1 way to prevent the spread of germs. Stay away from sick people or people who are getting over a cold. If you develop respiratory systems such as green/yellow mucus production or productive cough or persistent cough let us know and we will see if you need an antibiotic. It is a good idea to keep a pair of gloves on when going into grocery stores/[employer] to decrease your risk of coming into contact with germs on the carts, etc. Carry alcohol hand gel with you at all times and use it frequently if out in public. If your temperature reaches 100.5 or higher please call the clinic and let us know.  If it is after hours or on the weekend please go to the ER if your temperature is over 100.5.  Please have your own personal thermometer at home to use.    Sex and bodily fluids If you are going to have  sex, a condom must be used to protect the person that isn't taking chemotherapy. Chemo can decrease your libido (sex drive). For a few days after chemotherapy, chemotherapy can be excreted through your bodily fluids.  When using the toilet please close the lid and flush the toilet twice.  Do this for a few day after you have had chemotherapy.   Effects of chemotherapy on your sex life Some changes are simple and won't last long. They won't affect your sex life permanently. Sometimes you may feel: . too tired . not strong enough to be very active . sick or sore  . not in the mood . anxious or low Your anxiety might not seem related to sex. For example, you may be worried about the cancer and how your treatment is going. Or you may be worried about money, or about how you family are coping with your illness. These things can cause stress, which can affect your interest in sex. It's important to talk to your partner about how you feel. Remember - the changes to your sex life don't usually last long. There's usually no medical reason to stop having sex during chemo. The drugs won't have any long term physical effects on your performance or enjoyment of sex. Cancer can't be passed on to your partner during sex  Contraception It's important to use reliable contraception during treatment. Avoid getting pregnant while you or your partner are having chemotherapy. This is because the drugs may harm the baby. Sometimes chemotherapy drugs can leave a man or woman infertile.  This means you would not be able to have children in the future. You might want to talk to someone about permanent infertility. It can be very difficult to learn  that you may no longer be able to have children. Some people find counselling helpful. There might be ways to preserve your fertility, although this is easier for men than for women. You may want to speak to a fertility expert. You can talk about sperm banking or harvesting your  eggs. You can also ask about other fertility options, such as donor eggs. If you have or have had breast cancer, your doctor might advise you not to take the contraceptive pill. This is because the hormones in it might affect the cancer.  It is not known for sure whether or not chemotherapy drugs can be passed on through semen or secretions from the vagina. Because of this some doctors advise people to use a barrier method if you have sex during treatment. This applies to vaginal, anal or oral sex. Generally, doctors advise a barrier method only for the time you are actually having the treatment and for about a week after your treatment. Advice like this can be worrying, but this does not mean that you have to avoid being intimate with your partner. You can still have close contact with your partner and continue to enjoy sex.  Animals If you have cats or birds we just ask that you not change the litter or change the cage.  Please have someone else do this for you while you are on chemotherapy.   Food Safety During and After Cancer Treatment Food safety is important for people both during and after cancer treatment. Cancer and cancer treatments, such as chemotherapy, radiation therapy, and stem cell/bone marrow transplantation, often weaken the immune system. This makes it harder for your body to protect itself from foodborne illness, also called food poisoning. Foodborne illness is caused by eating food that contains harmful bacteria, parasites, or viruses.  Foods to avoid Some foods have a higher risk of becoming tainted with bacteria. These include: Marland Kitchen Unwashed fresh fruit and vegetables, especially leafy vegetables that can hide dirt and other contaminants . Raw sprouts, such as alfalfa sprouts . Raw or undercooked beef, especially ground beef, or other raw or undercooked meat and poultry . Fatty, fried, or spicy foods immediately before or after treatment.  These can sit heavy on your stomach and  make you feel nauseous. . Raw or undercooked shellfish, such as oysters. . Sushi and sashimi, which often contain raw fish.  . Unpasteurized beverages, such as unpasteurized fruit juices, raw milk, raw yogurt, or cider . Undercooked eggs, such as soft boiled, over easy, and poached; raw, unpasteurized eggs; or foods made with raw egg, such as homemade raw cookie dough and homemade mayonnaise Simple steps for food safety Shop smart. . Do not buy food stored or displayed in an unclean area. . Do not buy bruised or damaged fruits or vegetables. . Do not buy cans that have cracks, dents, or bulges. . Pick up foods that can spoil at the end of your shopping trip and store them in a cooler on the way home. Prepare and clean up foods carefully. . Rinse all fresh fruits and vegetables under running water, and dry them with a clean towel or paper towel. . Clean the top of cans before opening them. . After preparing food, wash your hands for 20 seconds with hot water and soap. Pay special attention to areas between fingers and under nails. . Clean your utensils and dishes with hot water and soap. Marland Kitchen Disinfect your kitchen and cutting boards using 1 teaspoon of liquid, unscented bleach mixed  into 1 quart of water.   Dispose of old food. . Eat canned and packaged food before its expiration date (the "use by" or "best before" date). . Consume refrigerated leftovers within 3 to 4 days. After that time, throw out the food. Even if the food does not smell or look spoiled, it still may be unsafe. Some bacteria, such as Listeria, can grow even on foods stored in the refrigerator if they are kept for too long. Take precautions when eating out. . At restaurants, avoid buffets and salad bars where food sits out for a long time and comes in contact with many people. Food can become contaminated when someone with a virus, often a norovirus, or another "bug" handles it. . Put any leftover food in a "to-go" container  yourself, rather than having the server do it. And, refrigerate leftovers as soon as you get home. . Choose restaurants that are clean and that are willing to prepare your food as you order it cooked.   Over-the-Counter Meds:  Colace - 100 mg capsules - take 2 capsules daily.  If this doesn't help then you can increase to 2 capsules twice daily.  Call us if this does not help your bowels move.   Imodium 2mg  capsule. Take 2 capsules after the 1st loose stool and then 1 capsule every 2 hours until you go a total of 12 hours without having a loose stool. Call the [facility] if loose stools continue. If diarrhea occurs at bedtime, take 2 capsules at bedtime. Then take 2 capsules every 4 hours until morning. Call [facility].    Diarrhea Sheet   If you are having loose stools/diarrhea, please purchase Imodium and begin taking as outlined:  At the first sign of poorly formed or loose stools you should begin taking Imodium (loperamide) 2 mg capsules.  Take two caplets (4mg ) followed by one caplet (2mg ) every 2 hours until you have had no diarrhea for 12 hours.  During the night take two caplets (4mg ) at bedtime and continue every 4 hours during the night until the morning.  Stop taking Imodium only after there is no sign of diarrhea for 12 hours.    Always call the [facility] if you are having loose stools/diarrhea that you can't get under control.  Loose stools/diarrhea leads to dehydration (loss of water) in your body.  We have other options of trying to get the loose stools/diarrhea to stop but you must let us know!   Constipation Sheet  Colace - 100 mg capsules - take 2 capsules daily.  If this doesn't help then you can increase to 2 capsules twice daily.  Please call if the above does not work for you.   Do not go more than 2 days without a bowel movement.  It is very important that you do not become constipated.  It will make you feel sick to your stomach (nausea) and can cause  abdominal pain and vomiting.   Nausea Sheet   Compazine/Prochlorperazine 10mg  tablet. Take 1 tablet every 6 hours as needed for nausea/vomiting. (This can make you sleepy)  If you are having persistent nausea (nausea that does not stop) please call the [facility] and let us know the amount of nausea that you are experiencing.  If you begin to vomit, you need to call the [facility] and if it is the weekend and you have vomited more than one time and can't get it to stop-go to the Emergency Room.  Persistent  nausea/vomiting can lead to dehydration (loss of fluid in your body) and will make you feel terrible.   Ice chips, sips of clear liquids, foods that are @ room temperature, crackers, and toast tend to be better tolerated.   SYMPTOMS TO REPORT AS SOON AS POSSIBLE AFTER TREATMENT:   FEVER GREATER THAN 100.5 F  CHILLS WITH OR WITHOUT FEVER  NAUSEA AND VOMITING THAT IS NOT CONTROLLED WITH YOUR NAUSEA MEDICATION  UNUSUAL SHORTNESS OF BREATH  UNUSUAL BRUISING OR BLEEDING  TENDERNESS IN MOUTH AND THROAT WITH OR WITHOUT PRESENCE OF ULCERS  URINARY PROBLEMS  BOWEL PROBLEMS  UNUSUAL RASH   What to do if you need assistance after hours or on the weekends: CALL [phone].  HOLD on the line, do not hang up.  You will hear multiple messages but at the end you will be connected with a nurse triage line.  They will contact the doctor if necessary.  Most of the time they will be able to assist you.  Do not call the hospital operator.       I have been informed and understand all of the instructions given to me and have received a copy. I have been instructed to call the clinic [phone] or my family physician as soon as possible for continued medical care, if indicated. I do not have any more questions at this time but understand that I may call the [facility] or the Patient Navigator at [phone] during office hours should I have questions or need assistance in  obtaining follow-up care.

## 2017-10-09 ENCOUNTER — Encounter (HOSPITAL_COMMUNITY): Payer: Self-pay | Admitting: General Practice

## 2017-10-09 NOTE — Progress Notes (Signed)
[facility] CSW Progress Notes  Call to patient after recent chemo ed class, assess for needs/resources.  States he is doing "fine", no needs at this time.  Brief education about availability of CSW and support programming.  Encouraged to access as needed.  Edwyna Shell, LCSW Clinical Social Worker Phone:  [phone]

## 2017-10-16 ENCOUNTER — Telehealth (HOSPITAL_COMMUNITY): Payer: Self-pay | Admitting: Hematology

## 2017-10-16 NOTE — Telephone Encounter (Signed)
AFINITOR SCRIPT HAD TO BE TRANSFERRED FROM [facility] TO [facility]. FINALLY APPROVED BY INS AFTER AN ISSUE WITH PTS ELIG.

## 2017-10-17 ENCOUNTER — Telehealth (HOSPITAL_COMMUNITY): Payer: Self-pay | Admitting: Hematology

## 2017-10-17 NOTE — Telephone Encounter (Signed)
Per Toney Rakes script was rcvd by [facility] and is in process. 72hr turn around

## 2017-10-18 ENCOUNTER — Telehealth (HOSPITAL_COMMUNITY): Payer: Self-pay | Admitting: Hematology

## 2017-10-18 NOTE — Telephone Encounter (Signed)
PC FROM [facility]. NEEDING TO VERIFY PTS DX AND RX DIRECTIONS. PER PTS PLANS ANY MEDS THAT WILL COST OVER $20,000 REQUIRE REVIEW. HE STATED HE WILL WORK W/PT [facility] TO GET MED TO PT ASAP.

## 2017-10-23 ENCOUNTER — Encounter (HOSPITAL_COMMUNITY): Payer: Self-pay | Admitting: *Deleted

## 2017-10-23 NOTE — Progress Notes (Signed)
I spoke with patient via telephone and advised that the pharmacy was awaiting a return call from him to set up his Afinitor for delivery.  I provided patient with the number and he verbalized intent to call them right away.

## 2017-10-24 ENCOUNTER — Telehealth (HOSPITAL_COMMUNITY): Payer: Self-pay | Admitting: Hematology

## 2017-10-24 ENCOUNTER — Telehealth (HOSPITAL_COMMUNITY): Payer: Self-pay | Admitting: *Deleted

## 2017-10-24 NOTE — Telephone Encounter (Signed)
PT CALLED TO LET us KNOW HE HAS RECVD HIS AFINITOR BY MAIL.

## 2017-10-24 NOTE — Telephone Encounter (Signed)
Patient called stating he received his medication today.

## 2017-10-28 ENCOUNTER — Other Ambulatory Visit (HOSPITAL_COMMUNITY): Payer: Self-pay | Admitting: Hematology

## 2017-10-28 DIAGNOSIS — D3A012 Benign carcinoid tumor of the ileum: Secondary | ICD-10-CM

## 2017-11-01 ENCOUNTER — Other Ambulatory Visit (HOSPITAL_COMMUNITY): Payer: Managed Care, Other (non HMO)

## 2017-11-01 ENCOUNTER — Ambulatory Visit (HOSPITAL_COMMUNITY): Payer: Managed Care, Other (non HMO)

## 2017-11-01 ENCOUNTER — Ambulatory Visit (HOSPITAL_COMMUNITY): Payer: Managed Care, Other (non HMO) | Admitting: Internal Medicine

## 2017-11-05 ENCOUNTER — Encounter (HOSPITAL_COMMUNITY): Payer: Self-pay

## 2017-11-05 ENCOUNTER — Inpatient Hospital Stay (HOSPITAL_COMMUNITY): Payer: Medicaid - Out of State | Attending: Internal Medicine

## 2017-11-05 ENCOUNTER — Other Ambulatory Visit: Payer: Self-pay

## 2017-11-05 VITALS — BP 119/77 | HR 85 | Temp 98.2°F | Resp 18 | Wt 157.5 lb

## 2017-11-05 DIAGNOSIS — D3A012 Benign carcinoid tumor of the ileum: Secondary | ICD-10-CM

## 2017-11-05 DIAGNOSIS — C7B02 Secondary carcinoid tumors of liver: Secondary | ICD-10-CM | POA: Insufficient documentation

## 2017-11-05 DIAGNOSIS — R197 Diarrhea, unspecified: Secondary | ICD-10-CM | POA: Insufficient documentation

## 2017-11-05 DIAGNOSIS — C7A012 Malignant carcinoid tumor of the ileum: Secondary | ICD-10-CM | POA: Insufficient documentation

## 2017-11-05 MED ORDER — LANREOTIDE ACETATE 120 MG/0.5ML ~~LOC~~ SOLN
120.0000 mg | Freq: Once | SUBCUTANEOUS | Status: AC
  Administered 2017-11-05: 120 mg via SUBCUTANEOUS
  Filled 2017-11-05: qty 120

## 2017-11-05 NOTE — Progress Notes (Signed)
Lanreotide given per orders. Patient tolerated it well without problems. Vitals stable and discharged home from clinic ambulatory. Follow up as scheduled.  

## 2017-11-05 NOTE — Patient Instructions (Signed)
[place] at [facility] Discharge Instructions  Lanreotide given today Follow up as scheduled.   Thank you for choosing [place] at [facility] to provide your oncology and hematology care.  To afford each patient quality time with our provider, please arrive at least 15 minutes before your scheduled appointment time.   If you have a lab appointment with the [facility] please come in thru the  Main Entrance and check in at the main information desk  You need to re-schedule your appointment should you arrive 10 or more minutes late.  We strive to give you quality time with our providers, and arriving late affects you and other patients whose appointments are after yours.  Also, if you no show three or more times for appointments you may be dismissed from the clinic at the providers discretion.     Again, thank you for choosing [facility].  Our hope is that these requests will decrease the amount of time that you wait before being seen by our physicians.       _____________________________________________________________  Should you have questions after your visit to [facility], please contact our office at [phone] between the hours of 8:00 a.m. and 4:30 p.m.  Voicemails left after 4:00 p.m. will not be returned until the following business day.  For prescription refill requests, have your pharmacy contact our office and allow 72 hours.    Cancer Center Support Programs:   > Cancer Support Group  2nd Tuesday of the month 1pm-2pm, Journey Room

## 2017-11-08 ENCOUNTER — Other Ambulatory Visit (HOSPITAL_COMMUNITY): Payer: Self-pay

## 2017-11-08 ENCOUNTER — Ambulatory Visit (HOSPITAL_COMMUNITY): Payer: Self-pay | Admitting: Hematology

## 2017-11-08 NOTE — Progress Notes (Signed)
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  He was treated with Sandostatin LAR monthly injections from 12/11/2011 through December 2016, by Dr. 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 male returns for routine follow-up for metastatic carcinoid tumor to the liver. Patient is here with his wife. He reports having one episode of high blood pressure. He has checked it since and has not had an issue it has been normal since. He has done well with the pill he started. Diarrhea is about the same no difference. He is still having abdominal pain it has been increased over the past couple days. His pain is increased the few day right before needing his next injection. He reports his appetite is good and he is maintaining his  weight at this time. His energy level is at 75%. He is not wanting a flu shot this year.    REVIEW OF SYSTEMS:  Review of Systems  Gastrointestinal: Positive for abdominal pain and diarrhea.  All other systems reviewed and are negative.    PAST MEDICAL/SURGICAL HISTORY:  Past Medical History:  Diagnosis Date  . Carcinoid tumor of colon   . Carcinoid tumor of ileum    ileocecal valve  . Esophageal reflux    Past Surgical History:  Procedure Laterality Date  . HEMICOLECTOMY Right 2013  . INGUINAL HERNIA REPAIR       SOCIAL HISTORY:  Social History   Socioeconomic History  . Marital status: Single    Spouse name: Not on file  . Number of children: Not on file  . Years of education: Not on file  . Highest education level: Not on file  Occupational History  . Not on file  Social Needs  . Financial resource strain: Not on file  . Food insecurity:    Worry: Not on file    Inability: Not on file  . Transportation needs:    Medical: Not on file    Non-medical: Not on file  Tobacco Use  . Smoking status: Former Smoker    Packs/day: 1.00    Years: 35.00    Pack years: 35.00    Types: Cigarettes    Last attempt to quit: 02/14/2008    Years since quitting: 9.7  . Smokeless tobacco: Never Used  Substance and Sexual Activity  . Alcohol use: Never  Frequency: Never  . Drug use: Never  . Sexual activity: Not on file  Lifestyle  . Physical activity:    Days per week: Not on file    Minutes per session: Not on file  . Stress: Not on file  Relationships  . Social connections:    Talks on phone: Not on file    Gets together: Not on file    Attends religious service: Not on file    Active member of club or organization: Not on file    Attends meetings of clubs or organizations: Not on file    Relationship status: Not on file  . Intimate partner violence:    Fear of current or ex partner: Not on file    Emotionally abused: Not on file    Physically abused: Not on  file    Forced sexual activity: Not on file  Other Topics Concern  . Not on file  Social History Narrative  . Not on file    FAMILY HISTORY:  Family History  Problem Relation Age of Onset  . Heart attack Father 35  . Stroke Mother   . Diabetes Sister   . Other Daughter        healthy  . Lung cancer Other        uncle  . Heart disease Other        paternal side    CURRENT MEDICATIONS:  Outpatient Encounter Medications as of 11/09/2017  Medication Sig  . everolimus (AFINITOR) 5 MG tablet Take 2 tablets (10 mg total) by mouth daily.  Marland Kitchen lanreotide acetate (SOMATULINE DEPOT) 120 MG/0.5ML injection Inject 120 mg into the skin every 28 (twenty-eight) days.   No facility-administered encounter medications on file as of 11/09/2017.     ALLERGIES:  No Known Allergies   PHYSICAL EXAM:  ECOG Performance status: 1  Vitals:   11/09/17 0859  BP: 126/78  Pulse: (!) 53  Resp: 16  Temp: 98.3 F (36.8 C)  SpO2: 99%   Filed Weights   11/09/17 0859  Weight: 159 lb 4.8 oz (72.3 kg)    Physical Exam  Constitutional: He is oriented to person, place, and time. He appears well-developed and well-nourished.  Abdominal: Soft.  Musculoskeletal: Normal range of motion.  Neurological: He is alert and oriented to person, place, and time.  Skin: Skin is warm and dry.  Psychiatric: He has a normal mood and affect. His behavior is normal. Judgment and thought content normal.     LABORATORY DATA:  I have reviewed the labs as listed.  CBC    Component Value Date/Time   WBC 3.4 (L) 11/09/2017 0805   RBC 4.82 11/09/2017 0805   HGB 14.4 11/09/2017 0805   HCT 44.0 11/09/2017 0805   PLT 202 11/09/2017 0805   MCV 91.3 11/09/2017 0805   MCH 29.9 11/09/2017 0805   MCHC 32.7 11/09/2017 0805   RDW 13.8 11/09/2017 0805   LYMPHSABS 1.4 11/09/2017 0805   MONOABS 0.4 11/09/2017 0805   EOSABS 0.3 11/09/2017 0805   BASOSABS 0.1 11/09/2017 0805   CMP Latest Ref Rng & Units 11/09/2017  09/04/2017 06/07/2017  Glucose 70 - 99 mg/dL 110(H) 116(H) 134(H)  BUN 6 - 20 mg/dL 7 16 11   Creatinine 0.61 - 1.24 mg/dL 0.83 1.15 1.04  Sodium 135 - 145 mmol/L 139 138 137  Potassium 3.5 - 5.1 mmol/L 3.8 4.8 3.8  Chloride 98 - 111 mmol/L 104 101 97(L)  CO2 22 - 32 mmol/L 27 29 26  Calcium 8.9 - 10.3 mg/dL 8.8(L) 9.2 8.6(L)  Total Protein 6.5 - 8.1 g/dL 7.4 7.3 7.2  Total Bilirubin 0.3 - 1.2 mg/dL 0.2(L) 0.8 2.2(H)  Alkaline Phos 38 - 126 U/L 247(H) 249(H) 371(H)  AST 15 - 41 U/L 27 22 40  ALT 0 - 44 U/L 19 12 30        ASSESSMENT & PLAN:   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 -Diagnosed with metastatic disease in the liver on 03/17/2017, started on lanreotide injections on 04/11/2017 with improvement in right upper quadrant pain. -He has diarrhea on and off based on what he eats.  He is continuing to work full-time job. -If there is progression, adding everolimus will be an option.  Peptide receptor radioligand therapy (PRRT) is also an option following progression on the somatostatin analogue. - He is tolerating lanreotide injections very well.  He does occasionally get pain in bilateral upper quadrants, mostly on the right side. -I discussed and reviewed the images of the CT scan dated 10/02/2017 which showed mild progression of hepatic metastasis, by few millimeters.  Portal caval adenopathy has also grown by few millimeters.  Hence I have recommended adding everolimus to lanreotide. -He started on everolimus 5 mg daily on 10/25/2017.  He had one episode of high blood pressure.  He is not on any blood pressure medication.  Today blood pressure was within normal limits.  Denied any side effects from it so far.  Labs today are within normal limits. -We will reevaluate him in 2 weeks with repeat labs.  If he is  continuing to do well, I will increase it to full dose of 10 mg daily. - I have offered him flu vaccine today.  He does not want to take it as he had flu when he took it several years ago.  He will think about it and let me know in 2 weeks. - He has [place].  His insurance does not cover in [place] starting end of October.  We will make a referral to Dr. Marlene Lard in [place].      Orders placed this encounter:  Orders Placed This Encounter  Procedures  . Chromogranin A  . CBC with Differential/Platelet  . Comprehensive metabolic panel      Derek Jack, MD [place] [phone]

## 2017-11-09 ENCOUNTER — Inpatient Hospital Stay (HOSPITAL_COMMUNITY): Payer: Medicaid - Out of State

## 2017-11-09 ENCOUNTER — Other Ambulatory Visit: Payer: Self-pay

## 2017-11-09 ENCOUNTER — Encounter (HOSPITAL_COMMUNITY): Payer: Self-pay | Admitting: Hematology

## 2017-11-09 ENCOUNTER — Inpatient Hospital Stay (HOSPITAL_BASED_OUTPATIENT_CLINIC_OR_DEPARTMENT_OTHER): Payer: Medicaid - Out of State | Admitting: Hematology

## 2017-11-09 VITALS — BP 126/78 | HR 53 | Temp 98.3°F | Resp 16 | Wt 159.3 lb

## 2017-11-09 DIAGNOSIS — C7A012 Malignant carcinoid tumor of the ileum: Secondary | ICD-10-CM | POA: Diagnosis not present

## 2017-11-09 DIAGNOSIS — C7B02 Secondary carcinoid tumors of liver: Secondary | ICD-10-CM | POA: Diagnosis not present

## 2017-11-09 DIAGNOSIS — R197 Diarrhea, unspecified: Secondary | ICD-10-CM | POA: Diagnosis not present

## 2017-11-09 DIAGNOSIS — D3A012 Benign carcinoid tumor of the ileum: Secondary | ICD-10-CM

## 2017-11-09 LAB — COMPREHENSIVE METABOLIC PANEL
ALBUMIN: 3.5 g/dL (ref 3.5–5.0)
ALT: 19 U/L (ref 0–44)
ANION GAP: 8 (ref 5–15)
AST: 27 U/L (ref 15–41)
Alkaline Phosphatase: 247 U/L — ABNORMAL HIGH (ref 38–126)
BUN: 7 mg/dL (ref 6–20)
CHLORIDE: 104 mmol/L (ref 98–111)
CO2: 27 mmol/L (ref 22–32)
Calcium: 8.8 mg/dL — ABNORMAL LOW (ref 8.9–10.3)
Creatinine, Ser: 0.83 mg/dL (ref 0.61–1.24)
GFR calc Af Amer: >60 mL/min (ref >60)
GFR calc non Af Amer: >60 mL/min (ref >60)
Glucose, Bld: 110 mg/dL — ABNORMAL HIGH (ref 70–99)
POTASSIUM: 3.8 mmol/L (ref 3.5–5.1)
Sodium: 139 mmol/L (ref 135–145)
Total Bilirubin: 0.2 mg/dL — ABNORMAL LOW (ref 0.3–1.2)
Total Protein: 7.4 g/dL (ref 6.5–8.1)

## 2017-11-09 LAB — CBC WITH DIFFERENTIAL/PLATELET
BASOS PCT: 2 %
Basophils Absolute: 0.1 10*3/uL (ref 0.0–0.1)
EOS PCT: 9 %
Eosinophils Absolute: 0.3 10*3/uL (ref 0.0–0.7)
HCT: 44 % (ref 39.0–52.0)
Hemoglobin: 14.4 g/dL (ref 13.0–17.0)
LYMPHS ABS: 1.4 10*3/uL (ref 0.7–4.0)
Lymphocytes Relative: 41 %
MCH: 29.9 pg (ref 26.0–34.0)
MCHC: 32.7 g/dL (ref 30.0–36.0)
MCV: 91.3 fL (ref 78.0–100.0)
MONOS PCT: 13 %
Monocytes Absolute: 0.4 10*3/uL (ref 0.1–1.0)
NEUTROS PCT: 37 %
Neutro Abs: 1.3 10*3/uL — ABNORMAL LOW (ref 1.7–7.7)
PLATELETS: 202 10*3/uL (ref 150–400)
RBC: 4.82 MIL/uL (ref 4.22–5.81)
RDW: 13.8 % (ref 11.5–15.5)
WBC: 3.4 10*3/uL — AB (ref 4.0–10.5)

## 2017-11-09 NOTE — Assessment & Plan Note (Signed)
1.  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 -Diagnosed with metastatic disease in the liver on 03/17/2017, started on lanreotide injections on 04/11/2017 with improvement in right upper quadrant pain. -He has diarrhea on and off based on what he eats.  He is continuing to work full-time job. -If there is progression, adding everolimus will be an option.  Peptide receptor radioligand therapy (PRRT) is also an option following progression on the somatostatin analogue. - He is tolerating lanreotide injections very well.  He does occasionally get pain in bilateral upper quadrants, mostly on the right side. -I discussed and reviewed the images of the CT scan dated 10/02/2017 which showed mild progression of hepatic metastasis, by few millimeters.  Portal caval adenopathy has also grown by few millimeters.  Hence I have recommended adding everolimus to lanreotide. -He started on everolimus 5 mg daily on 10/25/2017.  He had one episode of high blood pressure.  He is not on any blood pressure medication.  Today blood pressure was within normal limits.  Denied any side effects from it so far.  Labs today are within normal limits. -We will reevaluate him in 2 weeks with repeat labs.  If he is continuing to do well, I will increase it to full dose of 10 mg daily. - I have offered him flu vaccine today.  He does not want to take it as he had flu when he took it several years ago.  He will think about it and let me know in 2 weeks. - He has [place].  His insurance does not cover in [place] starting end of October.  We will make a referral to Dr. Marlene Lard in [place].

## 2017-11-09 NOTE — Patient Instructions (Signed)
[facility] at [facility] Discharge Instructions  Follow up in 2 weeks with labs    Thank you for choosing [facility] at [facility] to provide your oncology and hematology care.  To afford each patient quality time with our provider, please arrive at least 15 minutes before your scheduled appointment time.   If you have a lab appointment with the Cancer Center please come in thru the  Main Entrance and check in at the main information desk  You need to re-schedule your appointment should you arrive 10 or more minutes late.  We strive to give you quality time with our providers, and arriving late affects you and other patients whose appointments are after yours.  Also, if you no show three or more times for appointments you may be dismissed from the clinic at the providers discretion.     Again, thank you for choosing [facility].  Our hope is that these requests will decrease the amount of time that you wait before being seen by our physicians.       _____________________________________________________________  Should you have questions after your visit to [facility], please contact our office at [phone] between the hours of 8:00 a.m. and 4:30 p.m.  Voicemails left after 4:00 p.m. will not be returned until the following business day.  For prescription refill requests, have your pharmacy contact our office and allow 72 hours.    Cancer Center Support Programs:   > Cancer Support Group  2nd Tuesday of the month 1pm-2pm, Journey Room    

## 2017-11-13 LAB — CHROMOGRANIN A: Chromogranin A: 249 nmol/L — ABNORMAL HIGH (ref 0–5)

## 2017-11-23 ENCOUNTER — Inpatient Hospital Stay (HOSPITAL_COMMUNITY): Payer: Managed Care, Other (non HMO) | Attending: Hematology | Admitting: Hematology

## 2017-11-23 ENCOUNTER — Encounter (HOSPITAL_COMMUNITY): Payer: Self-pay | Admitting: Hematology

## 2017-11-23 ENCOUNTER — Inpatient Hospital Stay (HOSPITAL_COMMUNITY): Payer: Managed Care, Other (non HMO) | Attending: Internal Medicine

## 2017-11-23 DIAGNOSIS — D3A012 Benign carcinoid tumor of the ileum: Secondary | ICD-10-CM

## 2017-11-23 DIAGNOSIS — C7A029 Malignant carcinoid tumor of the large intestine, unspecified portion: Secondary | ICD-10-CM | POA: Diagnosis present

## 2017-11-23 DIAGNOSIS — C7B02 Secondary carcinoid tumors of liver: Secondary | ICD-10-CM | POA: Diagnosis not present

## 2017-11-23 DIAGNOSIS — E340 Carcinoid syndrome: Secondary | ICD-10-CM | POA: Diagnosis present

## 2017-11-23 LAB — COMPREHENSIVE METABOLIC PANEL
ALBUMIN: 3.7 g/dL (ref 3.5–5.0)
ALT: 19 U/L (ref 0–44)
ANION GAP: 11 (ref 5–15)
AST: 33 U/L (ref 15–41)
Alkaline Phosphatase: 196 U/L — ABNORMAL HIGH (ref 38–126)
BUN: 7 mg/dL (ref 6–20)
CHLORIDE: 106 mmol/L (ref 98–111)
CO2: 23 mmol/L (ref 22–32)
Calcium: 9 mg/dL (ref 8.9–10.3)
Creatinine, Ser: 0.91 mg/dL (ref 0.61–1.24)
GFR calc non Af Amer: >60 mL/min (ref >60)
Glucose, Bld: 135 mg/dL — ABNORMAL HIGH (ref 70–99)
POTASSIUM: 3.9 mmol/L (ref 3.5–5.1)
Sodium: 140 mmol/L (ref 135–145)
Total Bilirubin: 0.7 mg/dL (ref 0.3–1.2)
Total Protein: 7.7 g/dL (ref 6.5–8.1)

## 2017-11-23 LAB — CBC WITH DIFFERENTIAL/PLATELET
ABS IMMATURE GRANULOCYTES: 0.01 10*3/uL (ref 0.00–0.07)
BASOS PCT: 1 %
Basophils Absolute: 0.1 10*3/uL (ref 0.0–0.1)
Eosinophils Absolute: 0.4 10*3/uL (ref 0.0–0.5)
Eosinophils Relative: 8 %
HCT: 48 % (ref 39.0–52.0)
HEMOGLOBIN: 15.6 g/dL (ref 13.0–17.0)
Immature Granulocytes: 0 %
LYMPHS PCT: 33 %
Lymphs Abs: 1.5 10*3/uL (ref 0.7–4.0)
MCH: 29 pg (ref 26.0–34.0)
MCHC: 32.5 g/dL (ref 30.0–36.0)
MCV: 89.2 fL (ref 80.0–100.0)
Monocytes Absolute: 0.4 10*3/uL (ref 0.1–1.0)
Monocytes Relative: 9 %
NEUTROS ABS: 2.2 10*3/uL (ref 1.7–7.7)
Neutrophils Relative %: 49 %
PLATELETS: 180 10*3/uL (ref 150–400)
RBC: 5.38 MIL/uL (ref 4.22–5.81)
RDW: 13.5 % (ref 11.5–15.5)
WBC: 4.5 10*3/uL (ref 4.0–10.5)
nRBC: 0 % (ref 0.0–0.2)

## 2017-11-23 MED ORDER — EVEROLIMUS 5 MG PO TABS: 10.0000 mg | ORAL_TABLET | Freq: Every day | ORAL | 2 refills | Status: DC

## 2017-11-23 NOTE — Progress Notes (Signed)
[place], [place] [address]   CLINIC:  Medical Oncology/Hematology  PCP:  Michael Chroman, MD [place] [id number] [phone]   REASON FOR VISIT: Follow-up for metastatic carcinoid tumor to the liver.  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  He was treated with Sandostatin LAR monthly injections from 12/11/2011 through December 2016, by Dr. 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 male returns for routine follow-up for metastatic carcinoid tumor to the liver. Patient is doing well today. He reports all his food is bland and his taste isnt good. He has occasional diarrhea. He denies any new or worsening pain in his abdomen. He still has abdominal discomfort but it is improving. Denies any nausea or vomiting. Denies any fevers or recent infections. He reports his appetite and energy level 75%.    REVIEW OF SYSTEMS:  Review of Systems  All other systems reviewed and are negative.    PAST  MEDICAL/SURGICAL HISTORY:  Past Medical History:  Diagnosis Date  . Carcinoid tumor of colon   . Carcinoid tumor of ileum    ileocecal valve  . Esophageal reflux    Past Surgical History:  Procedure Laterality Date  . HEMICOLECTOMY Right 2013  . INGUINAL HERNIA REPAIR       SOCIAL HISTORY:  Social History   Socioeconomic History  . Marital status: Single    Spouse name: Not on file  . Number of children: Not on file  . Years of education: Not on file  . Highest education level: Not on file  Occupational History  . Not on file  Social Needs  . Financial resource strain: Not on file  . Food insecurity:    Worry: Not on file    Inability: Not on file  . Transportation needs:    Medical: Not on file    Non-medical: Not on file  Tobacco Use  . Smoking status: Former Smoker    Packs/day: 1.00    Years: 35.00    Pack years: 35.00    Types: Cigarettes    Last attempt to quit: 02/14/2008    Years since quitting: 9.7  . Smokeless tobacco: Never Used  Substance and Sexual Activity  . Alcohol use: Never    Frequency: Never  . Drug use: Never  . Sexual activity: Not on file  Lifestyle  . Physical activity:    Days per week: Not on file    Minutes per session: Not on file  . Stress: Not on file  Relationships  . Social  connections:    Talks on phone: Not on file    Gets together: Not on file    Attends religious service: Not on file    Active member of club or organization: Not on file    Attends meetings of clubs or organizations: Not on file    Relationship status: Not on file  . Intimate partner violence:    Fear of current or ex partner: Not on file    Emotionally abused: Not on file    Physically abused: Not on file    Forced sexual activity: Not on file  Other Topics Concern  . Not on file  Social History Narrative  . Not on file    FAMILY HISTORY:  Family History  Problem Relation Age of Onset  . Heart attack Father 74  . Stroke Mother   . Diabetes  Sister   . Other Daughter        healthy  . Lung cancer Other        uncle  . Heart disease Other        paternal side    CURRENT MEDICATIONS:  Outpatient Encounter Medications as of 11/23/2017  Medication Sig  . everolimus (AFINITOR) 5 MG tablet Take 2 tablets (10 mg total) by mouth daily.  Marland Kitchen lanreotide acetate (SOMATULINE DEPOT) 120 MG/0.5ML injection Inject 120 mg into the skin every 28 (twenty-eight) days.  . [DISCONTINUED] everolimus (AFINITOR) 5 MG tablet Take 2 tablets (10 mg total) by mouth daily.   No facility-administered encounter medications on file as of 11/23/2017.     ALLERGIES:  No Known Allergies   PHYSICAL EXAM:  ECOG Performance status: 1  Vitals:   11/23/17 0812  BP: 139/73  Pulse: 79  Resp: 20  Temp: 98.2 F (36.8 C)  SpO2: 100%   Filed Weights   11/23/17 0812  Weight: 160 lb (72.6 kg)    Physical Exam  Constitutional: He is oriented to person, place, and time. He appears well-developed and well-nourished.  Musculoskeletal: Normal range of motion.  Neurological: He is alert and oriented to person, place, and time.  Skin: Skin is warm and dry.  Psychiatric: He has a normal mood and affect. His behavior is normal. Judgment and thought content normal.  Abdominal: soft, non-tender, non-distended, no palpable hepatomegaly. Extremities: No hand-foot skin reaction.   LABORATORY DATA:  I have reviewed the labs as listed.  CBC    Component Value Date/Time   WBC 4.5 11/23/2017 0750   RBC 5.38 11/23/2017 0750   HGB 15.6 11/23/2017 0750   HCT 48.0 11/23/2017 0750   PLT 180 11/23/2017 0750   MCV 89.2 11/23/2017 0750   MCH 29.0 11/23/2017 0750   MCHC 32.5 11/23/2017 0750   RDW 13.5 11/23/2017 0750   LYMPHSABS 1.5 11/23/2017 0750   MONOABS 0.4 11/23/2017 0750   EOSABS 0.4 11/23/2017 0750   BASOSABS 0.1 11/23/2017 0750   CMP Latest Ref Rng & Units 11/23/2017 11/09/2017 09/04/2017  Glucose 70 - 99 mg/dL 135(H) 110(H) 116(H)  BUN 6 - 20 mg/dL 7  7 16   Creatinine 0.61 - 1.24 mg/dL 0.91 0.83 1.15  Sodium 135 - 145 mmol/L 140 139 138  Potassium 3.5 - 5.1 mmol/L 3.9 3.8 4.8  Chloride 98 - 111 mmol/L 106 104 101  CO2 22 - 32 mmol/L 23 27 29   Calcium 8.9 - 10.3 mg/dL 9.0 8.8(L) 9.2  Total Protein 6.5 - 8.1 g/dL 7.7 7.4 7.3  Total Bilirubin 0.3 - 1.2 mg/dL 0.7 0.2(L)  0.8  Alkaline Phos 38 - 126 U/L 196(H) 247(H) 249(H)  AST 15 - 41 U/L 33 27 22  ALT 0 - 44 U/L 19 19 12        ASSESSMENT & PLAN:   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 -Diagnosed with metastatic disease in the liver on 03/17/2017, started on lanreotide injections on 04/11/2017 with improvement in right upper quadrant pain. -He has diarrhea on and off based on what he eats.  He is continuing to work full-time job. -If there is progression, adding everolimus will be an option.  Peptide receptor radioligand therapy (PRRT) is also an option following progression on the somatostatin analogue. - He is tolerating lanreotide injections very well.  He does occasionally get pain in bilateral upper quadrants, mostly on the right side. -I discussed and reviewed the images of the CT scan dated 10/02/2017 which showed mild progression of hepatic metastasis, by few millimeters.  Portal caval adenopathy has also grown by few millimeters.  Hence I have recommended adding everolimus to lanreotide. -He was started on everolimus 5 mg daily on 10/25/2017.  He had one episode of high blood pressure.  Today his systolic blood pressure is 139.  We will keep a close eye on it.  If it goes further high, we will start him on amlodipine. - His right upper quadrant pain from liver metastasis has improved since the start of the pill.  His only side effect is change in taste. - I have suggested him to increase everolimus to 10  mg daily starting today.  We will see him back in 2 weeks for follow-up with repeat labs. - He has [place] which will not cover in [place] starting end of October.  We will make a referral to Dr. Marlene Lard in [place].       Orders placed this encounter:  Orders Placed This Encounter  Procedures  . CBC with Differential/Platelet  . Comprehensive metabolic panel  . Chromogranin A      Derek Jack, MD [place] [phone]

## 2017-11-23 NOTE — Patient Instructions (Signed)
[facility]  Discharge Instructions:   _______________________________________________________________  Thank you for choosing [facility] to provide your oncology and hematology care.  To afford each patient quality time with our providers, please arrive at least 15 minutes before your scheduled appointment.  You need to re-schedule your appointment if you arrive 10 or more minutes late.  We strive to give you quality time with our providers, and arriving late affects you and other patients whose appointments are after yours.  Also, if you no show three or more times for appointments you may be dismissed from the clinic.  Again, thank you for choosing [facility]. Our hope is that these requests will allow you access to exceptional care and in a timely manner. _______________________________________________________________  If you have questions after your visit, please contact our office at [phone] between the hours of 8:30 a.m. and 5:00 p.m. Voicemails left after 4:30 p.m. will not be returned until the following business day. _______________________________________________________________  For prescription refill requests, have your pharmacy contact our office. _______________________________________________________________  Recommendations made by the consultant and any test results will be sent to your referring physician. _______________________________________________________________ 

## 2017-11-23 NOTE — Assessment & Plan Note (Addendum)
1.  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 -Diagnosed with metastatic disease in the liver on 03/17/2017, started on lanreotide injections on 04/11/2017 with improvement in right upper quadrant pain. -He has diarrhea on and off based on what he eats.  He is continuing to work full-time job. -If there is progression, adding everolimus will be an option.  Peptide receptor radioligand therapy (PRRT) is also an option following progression on the somatostatin analogue. - He is tolerating lanreotide injections very well.  He does occasionally get pain in bilateral upper quadrants, mostly on the right side. -I discussed and reviewed the images of the CT scan dated 10/02/2017 which showed mild progression of hepatic metastasis, by few millimeters.  Portal caval adenopathy has also grown by few millimeters.  Hence I have recommended adding everolimus to lanreotide. -He was started on everolimus 5 mg daily on 10/25/2017.  He had one episode of high blood pressure.  Today his systolic blood pressure is 139.  We will keep a close eye on it.  If it goes further high, we will start him on amlodipine. - His right upper quadrant pain from liver metastasis has improved since the start of the pill.  His only side effect is change in taste. - I have suggested him to increase everolimus to 10 mg daily starting today.  We will see him back in 2 weeks for follow-up with repeat labs. - He has [place] which will not cover in [place] starting end of October.  We will make a referral to Dr. Marlene Lard in [place].

## 2017-11-27 ENCOUNTER — Other Ambulatory Visit (HOSPITAL_COMMUNITY): Payer: Self-pay | Admitting: *Deleted

## 2017-11-27 LAB — CHROMOGRANIN A: Chromogranin A: 258 nmol/L — ABNORMAL HIGH (ref 0–5)

## 2017-11-28 ENCOUNTER — Telehealth (HOSPITAL_COMMUNITY): Payer: Self-pay

## 2017-11-28 ENCOUNTER — Other Ambulatory Visit (HOSPITAL_COMMUNITY): Payer: Self-pay | Admitting: Nurse Practitioner

## 2017-11-28 DIAGNOSIS — C7B02 Secondary carcinoid tumors of liver: Secondary | ICD-10-CM

## 2017-11-28 MED ORDER — AMLODIPINE BESYLATE 5 MG PO TABS: 5.0000 mg | ORAL_TABLET | Freq: Every day | ORAL | 1 refills | Status: AC

## 2017-11-28 NOTE — Telephone Encounter (Signed)
Patient notified of prescription sent to his pharmacy with understanding verbalized.

## 2017-11-28 NOTE — Telephone Encounter (Signed)
-----   Message from Glennie Isle, NP-C sent at 11/28/2017  1:24 PM EDT ----- Will you please call him and let him know I called in a medication for him for his blood pressure  ----- Message ----- From: Penelope Galas, RN Sent: 11/27/2017  12:54 PM EDT To: Glennie Isle, NP-C  The patient started the Afinitor 10mg  as directed and is now having blood pressure problems.   On Sunday, it was 178/100 and on Monday it was 150/97.  You mentioned this increase may increase his blood pressure and wanted to know do you want to start him on a blood pressure pill.

## 2017-12-04 ENCOUNTER — Encounter (HOSPITAL_COMMUNITY): Payer: Self-pay

## 2017-12-04 ENCOUNTER — Ambulatory Visit (HOSPITAL_COMMUNITY): Payer: Medicaid - Out of State

## 2017-12-04 ENCOUNTER — Inpatient Hospital Stay (HOSPITAL_COMMUNITY): Payer: Managed Care, Other (non HMO)

## 2017-12-04 VITALS — BP 119/67 | HR 92 | Temp 98.3°F | Resp 20

## 2017-12-04 DIAGNOSIS — R197 Diarrhea, unspecified: Secondary | ICD-10-CM | POA: Insufficient documentation

## 2017-12-04 DIAGNOSIS — D3A012 Benign carcinoid tumor of the ileum: Secondary | ICD-10-CM

## 2017-12-04 DIAGNOSIS — C7A029 Malignant carcinoid tumor of the large intestine, unspecified portion: Secondary | ICD-10-CM | POA: Diagnosis not present

## 2017-12-04 DIAGNOSIS — C7B02 Secondary carcinoid tumors of liver: Secondary | ICD-10-CM | POA: Insufficient documentation

## 2017-12-04 DIAGNOSIS — E340 Carcinoid syndrome: Secondary | ICD-10-CM | POA: Insufficient documentation

## 2017-12-04 MED ORDER — EVEROLIMUS 5 MG PO TABS: 10.0000 mg | ORAL_TABLET | Freq: Every day | ORAL | 2 refills | Status: AC

## 2017-12-04 MED ORDER — LANREOTIDE ACETATE 120 MG/0.5ML ~~LOC~~ SOLN
120.0000 mg | Freq: Once | SUBCUTANEOUS | Status: AC
  Administered 2017-12-04: 120 mg via SUBCUTANEOUS
  Filled 2017-12-04: qty 120

## 2017-12-04 NOTE — Patient Instructions (Signed)
[facility]  Discharge Instructions:   _______________________________________________________________  Thank you for choosing [facility] to provide your oncology and hematology care.  To afford each patient quality time with our providers, please arrive at least 15 minutes before your scheduled appointment.  You need to re-schedule your appointment if you arrive 10 or more minutes late.  We strive to give you quality time with our providers, and arriving late affects you and other patients whose appointments are after yours.  Also, if you no show three or more times for appointments you may be dismissed from the clinic.  Again, thank you for choosing [facility]. Our hope is that these requests will allow you access to exceptional care and in a timely manner. _______________________________________________________________  If you have questions after your visit, please contact our office at [phone] between the hours of 8:30 a.m. and 5:00 p.m. Voicemails left after 4:30 p.m. will not be returned until the following business day. _______________________________________________________________  For prescription refill requests, have your pharmacy contact our office. _______________________________________________________________  Recommendations made by the consultant and any test results will be sent to your referring physician. _______________________________________________________________ 

## 2017-12-04 NOTE — Progress Notes (Signed)
Patient tolerated injection with no complaints voiced.  Site clean and dry with no bruising or swelling noted at site.  Band aid applied.  VSS with discharge and left ambulatory with family with no s/s of distress noted.

## 2017-12-11 ENCOUNTER — Ambulatory Visit (HOSPITAL_COMMUNITY): Payer: Medicaid - Out of State | Admitting: Hematology

## 2017-12-11 ENCOUNTER — Other Ambulatory Visit (HOSPITAL_COMMUNITY): Payer: Medicaid - Out of State

## 2017-12-11 ENCOUNTER — Inpatient Hospital Stay (HOSPITAL_BASED_OUTPATIENT_CLINIC_OR_DEPARTMENT_OTHER): Payer: Managed Care, Other (non HMO) | Admitting: Hematology

## 2017-12-11 ENCOUNTER — Encounter (HOSPITAL_COMMUNITY): Payer: Self-pay | Admitting: Hematology

## 2017-12-11 ENCOUNTER — Other Ambulatory Visit: Payer: Self-pay

## 2017-12-11 ENCOUNTER — Inpatient Hospital Stay (HOSPITAL_COMMUNITY): Payer: Managed Care, Other (non HMO)

## 2017-12-11 VITALS — BP 118/69 | HR 81 | Temp 98.5°F | Resp 16 | Wt 162.4 lb

## 2017-12-11 DIAGNOSIS — D3A012 Benign carcinoid tumor of the ileum: Secondary | ICD-10-CM

## 2017-12-11 DIAGNOSIS — C7B02 Secondary carcinoid tumors of liver: Secondary | ICD-10-CM | POA: Diagnosis not present

## 2017-12-11 DIAGNOSIS — C7A029 Malignant carcinoid tumor of the large intestine, unspecified portion: Secondary | ICD-10-CM

## 2017-12-11 DIAGNOSIS — R197 Diarrhea, unspecified: Secondary | ICD-10-CM

## 2017-12-11 DIAGNOSIS — C7A012 Malignant carcinoid tumor of the ileum: Secondary | ICD-10-CM

## 2017-12-11 DIAGNOSIS — E340 Carcinoid syndrome: Secondary | ICD-10-CM

## 2017-12-11 LAB — CBC WITH DIFFERENTIAL/PLATELET
Abs Immature Granulocytes: 0.01 10*3/uL (ref 0.00–0.07)
BASOS ABS: 0 10*3/uL (ref 0.0–0.1)
BASOS PCT: 1 %
EOS PCT: 2 %
Eosinophils Absolute: 0.1 10*3/uL (ref 0.0–0.5)
HCT: 37.8 % — ABNORMAL LOW (ref 39.0–52.0)
Hemoglobin: 12.3 g/dL — ABNORMAL LOW (ref 13.0–17.0)
Immature Granulocytes: 0 %
LYMPHS ABS: 0.8 10*3/uL (ref 0.7–4.0)
Lymphocytes Relative: 23 %
MCH: 28.5 pg (ref 26.0–34.0)
MCHC: 32.5 g/dL (ref 30.0–36.0)
MCV: 87.5 fL (ref 80.0–100.0)
MONO ABS: 0.6 10*3/uL (ref 0.1–1.0)
MONOS PCT: 17 %
NEUTROS PCT: 57 %
Neutro Abs: 2 10*3/uL (ref 1.7–7.7)
PLATELETS: 185 10*3/uL (ref 150–400)
RBC: 4.32 MIL/uL (ref 4.22–5.81)
RDW: 13.2 % (ref 11.5–15.5)
WBC: 3.5 10*3/uL — ABNORMAL LOW (ref 4.0–10.5)
nRBC: 0 % (ref 0.0–0.2)

## 2017-12-11 LAB — COMPREHENSIVE METABOLIC PANEL
ALT: 34 U/L (ref 0–44)
AST: 51 U/L — ABNORMAL HIGH (ref 15–41)
Albumin: 3.2 g/dL — ABNORMAL LOW (ref 3.5–5.0)
Alkaline Phosphatase: 440 U/L — ABNORMAL HIGH (ref 38–126)
Anion gap: 11 (ref 5–15)
BUN: 7 mg/dL (ref 6–20)
CHLORIDE: 100 mmol/L (ref 98–111)
CO2: 26 mmol/L (ref 22–32)
Calcium: 8.8 mg/dL — ABNORMAL LOW (ref 8.9–10.3)
Creatinine, Ser: 0.93 mg/dL (ref 0.61–1.24)
GFR calc non Af Amer: >60 mL/min (ref >60)
Glucose, Bld: 154 mg/dL — ABNORMAL HIGH (ref 70–99)
POTASSIUM: 3.1 mmol/L — AB (ref 3.5–5.1)
SODIUM: 137 mmol/L (ref 135–145)
Total Bilirubin: 1.1 mg/dL (ref 0.3–1.2)
Total Protein: 7.6 g/dL (ref 6.5–8.1)

## 2017-12-11 MED ORDER — POTASSIUM CHLORIDE CRYS ER 20 MEQ PO TBCR: 20.0000 meq | EXTENDED_RELEASE_TABLET | Freq: Once | ORAL | 2 refills | Status: AC

## 2017-12-11 NOTE — Assessment & Plan Note (Signed)
1.  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 -Diagnosed with metastatic disease in the liver on 03/17/2017, started on lanreotide injections on 04/11/2017 with improvement in right upper quadrant pain. -He has diarrhea on and off based on what he eats. - CT scan dated 10/02/2017  showed mild progression of hepatic metastasis, by few millimeters.  Portal caval adenopathy has also grown by few millimeters.  Hence I have recommended adding everolimus to lanreotide. -He was started on everolimus 5 mg daily on 10/25/2017.  He had one episode of high blood pressure.  - His right upper quadrant pain from liver metastasis has improved since the start of the pill.  His only side effect is change in taste. - His everolimus was increased to 10 mg daily on 11/23/2017.  He stopped taking the pills as he ran out of them on 12/06/2017.  He also developed pain in his legs and right knee pain.  His blood pressure also went up and was started on amlodipine 5 mg daily.  He did not receive his next shipment of everolimus.  We will look into providing him with samples. - Today's blood work shows elevated alk phos of 440.  AST was also mildly elevated at 31.  Potassium was low at 3.1.  We will start him on potassium 20 mEq daily.  His last serum chromogranin was 258 on 11/23/2017. - I would restart him back on everolimus 5 mg daily at this time.  I would be hesitant to increase it to 10 mg because of his intolerability. - We will continue monitoring with scans every 3 months.  Peptide receptor radioligand therapy (PRRT) is an option upon further progression. - He is in the process of changing insurances to [place].  I will make a referral to Dr. Marlene Lard in [place].

## 2017-12-11 NOTE — Progress Notes (Signed)
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  He was treated with Sandostatin LAR monthly injections from 12/11/2011 through December 2016, by Dr. Jacquiline Doe and Ledell Noss    03/17/2017 Initial Diagnosis    Metastatic malignant carcinoid tumor to liver [facility]), CT scan on 03/17/2017 showing large central necrotic mass in the right lobe of the liver measuring 10.5 cm, several other hypodense lesions in the left and right lobes of the liver, status post CT-guided liver biopsy on 03/29/2017 consistent with grade 2 neuroendocrine tumor, Ki-67 15-20%, 5 mitotic figures per 10 hpf  24-hour urine 5-HIAA elevated at 46, serum chromogranin elevated at 115    04/11/2017 Miscellaneous    Lanreotide 120 mg every 28 days started      INTERVAL HISTORY:  Mr. Taborda 62 y.o. male returns for routine follow-up for malignant carcinoid tumor of the liver. Patient is here with his wife. He is has been having bilateral leg pain with the increased dose of the medications. He feels weaker while on the medication. He ran out of the medication and has been off it for 5 days now. He also started the Norvasc for his blood pressure and stopped it due to his pressure became low. Patient has mild abdominal pain. He denies any nausea, vomiting, or diarrhea. He normally has constipation. Denies any  fevers or recent infections. He reports his appetite and energy level at 75% and he is maintaining his weight.     REVIEW OF SYSTEMS:  Review of Systems  Musculoskeletal:       Bilateral Leg pain  All other systems reviewed and are negative.    PAST MEDICAL/SURGICAL HISTORY:  Past Medical History:  Diagnosis Date  . Carcinoid tumor of colon   . Carcinoid tumor of ileum    ileocecal valve  . Esophageal reflux    Past Surgical History:  Procedure Laterality Date  . HEMICOLECTOMY Right 2013  . INGUINAL HERNIA REPAIR       SOCIAL HISTORY:  Social History   Socioeconomic History  . Marital status: Single    Spouse name: Not on file  . Number of children: Not on file  . Years of education: Not on file  . Highest education level: Not on file  Occupational History  . Not on file  Social Needs  . Financial resource strain: Not on file  . Food insecurity:    Worry: Not on file    Inability: Not on file  . Transportation needs:    Medical: Not on file    Non-medical: Not on file  Tobacco Use  . Smoking status: Former Smoker    Packs/day: 1.00    Years: 35.00    Pack years: 35.00    Types: Cigarettes    Last attempt to quit: 02/14/2008    Years since quitting: 9.8  . Smokeless tobacco: Never Used  Substance and Sexual Activity  . Alcohol use: Never  Frequency: Never  . Drug use: Never  . Sexual activity: Not on file  Lifestyle  . Physical activity:    Days per week: Not on file    Minutes per session: Not on file  . Stress: Not on file  Relationships  . Social connections:    Talks on phone: Not on file    Gets together: Not on file    Attends religious service: Not on file    Active member of club or organization: Not on file    Attends meetings of clubs or organizations: Not on file    Relationship status: Not on file  . Intimate partner violence:    Fear of current or ex partner: Not on file    Emotionally abused: Not on file    Physically abused:  Not on file    Forced sexual activity: Not on file  Other Topics Concern  . Not on file  Social History Narrative  . Not on file    FAMILY HISTORY:  Family History  Problem Relation Age of Onset  . Heart attack Father 18  . Stroke Mother   . Diabetes Sister   . Other Daughter        healthy  . Lung cancer Other        uncle  . Heart disease Other        paternal side    CURRENT MEDICATIONS:  Outpatient Encounter Medications as of 12/11/2017  Medication Sig  . lanreotide acetate (SOMATULINE DEPOT) 120 MG/0.5ML injection Inject 120 mg into the skin every 28 (twenty-eight) days.  Marland Kitchen amLODipine (NORVASC) 5 MG tablet Take 1 tablet (5 mg total) by mouth daily. (Patient not taking: Reported on 12/11/2017)  . everolimus (AFINITOR) 5 MG tablet Take 2 tablets (10 mg total) by mouth daily. (Patient not taking: Reported on 12/11/2017)  . potassium chloride SA (K-DUR,KLOR-CON) 20 MEQ tablet Take 1 tablet (20 mEq total) by mouth once for 1 dose.   No facility-administered encounter medications on file as of 12/11/2017.     ALLERGIES:  No Known Allergies   PHYSICAL EXAM:  ECOG Performance status: 1  Vitals:   12/11/17 1101  BP: 118/69  Pulse: 81  Resp: 16  Temp: 98.5 F (36.9 C)  SpO2: 98%   Filed Weights   12/11/17 1101  Weight: 162 lb 6.4 oz (73.7 kg)    Physical Exam  Constitutional: He is oriented to person, place, and time. He appears well-developed and well-nourished.  Abdominal: Soft.  Musculoskeletal: Normal range of motion.  Bilateral leg pain with ambulation  Neurological: He is alert and oriented to person, place, and time.  Skin: Skin is warm and dry.  Psychiatric: He has a normal mood and affect. His behavior is normal. Judgment and thought content normal.  Abdomen: No palpable hepatomegaly.  Nontender.  No palpable masses. Extremities: No joint swellings.  No edema or cyanosis.   LABORATORY DATA:  I have reviewed the labs as listed.  CBC      Component Value Date/Time   WBC 3.5 (L) 12/11/2017 0954   RBC 4.32 12/11/2017 0954   HGB 12.3 (L) 12/11/2017 0954   HCT 37.8 (L) 12/11/2017 0954   PLT 185 12/11/2017 0954   MCV 87.5 12/11/2017 0954   MCH 28.5 12/11/2017 0954   MCHC 32.5 12/11/2017 0954   RDW 13.2 12/11/2017 0954   LYMPHSABS 0.8 12/11/2017 0954   MONOABS 0.6 12/11/2017 0954   EOSABS 0.1 12/11/2017 0954   BASOSABS 0.0 12/11/2017  0954   CMP Latest Ref Rng & Units 12/11/2017 11/23/2017 11/09/2017  Glucose 70 - 99 mg/dL 154(H) 135(H) 110(H)  BUN 6 - 20 mg/dL 7 7 7   Creatinine 0.61 - 1.24 mg/dL 0.93 0.91 0.83  Sodium 135 - 145 mmol/L 137 140 139  Potassium 3.5 - 5.1 mmol/L 3.1(L) 3.9 3.8  Chloride 98 - 111 mmol/L 100 106 104  CO2 22 - 32 mmol/L 26 23 27   Calcium 8.9 - 10.3 mg/dL 8.8(L) 9.0 8.8(L)  Total Protein 6.5 - 8.1 g/dL 7.6 7.7 7.4  Total Bilirubin 0.3 - 1.2 mg/dL 1.1 0.7 0.2(L)  Alkaline Phos 38 - 126 U/L 440(H) 196(H) 247(H)  AST 15 - 41 U/L 51(H) 33 27  ALT 0 - 44 U/L 34 19 19        ASSESSMENT & PLAN:   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 -Diagnosed with metastatic disease in the liver on 03/17/2017, started on lanreotide injections on 04/11/2017 with improvement in right upper quadrant pain. -He has diarrhea on and off based on what he eats. - CT scan dated 10/02/2017  showed mild progression of hepatic metastasis, by few millimeters.  Portal caval adenopathy has also grown by few millimeters.  Hence I have recommended adding everolimus to lanreotide. -He was started on everolimus 5 mg daily on 10/25/2017.  He had one episode of high blood pressure.  - His right upper quadrant pain from liver metastasis has improved since the start of the pill.  His only side effect is change in taste. - His everolimus was increased to 10  mg daily on 11/23/2017.  He stopped taking the pills as he ran out of them on 12/06/2017.  He also developed pain in his legs and right knee pain.  His blood pressure also went up and was started on amlodipine 5 mg daily.  He did not receive his next shipment of everolimus.  We will look into providing him with samples. - Today's blood work shows elevated alk phos of 440.  AST was also mildly elevated at 31.  Potassium was low at 3.1.  We will start him on potassium 20 mEq daily.  His last serum chromogranin was 258 on 11/23/2017. - I would restart him back on everolimus 5 mg daily at this time.  I would be hesitant to increase it to 10 mg because of his intolerability. - We will continue monitoring with scans every 3 months.  Peptide receptor radioligand therapy (PRRT) is an option upon further progression. - He is in the process of changing insurances to [place].  I will make a referral to Dr. Marlene Lard in [place].      Orders placed this encounter:  No orders of the defined types were placed in this encounter.     Derek Jack, MD [place] [phone]

## 2017-12-11 NOTE — Patient Instructions (Signed)
[facility] at [facility] Discharge Instructions  Follow up with your new MD in [place].    Thank you for choosing [place] at [facility] to provide your oncology and hematology care.  To afford each patient quality time with our provider, please arrive at least 15 minutes before your scheduled appointment time.   If you have a lab appointment with the [facility] please come in thru the  Main Entrance and check in at the main information desk  You need to re-schedule your appointment should you arrive 10 or more minutes late.  We strive to give you quality time with our providers, and arriving late affects you and other patients whose appointments are after yours.  Also, if you no show three or more times for appointments you may be dismissed from the clinic at the providers discretion.     Again, thank you for choosing [facility].  Our hope is that these requests will decrease the amount of time that you wait before being seen by our physicians.       _____________________________________________________________  Should you have questions after your visit to [facility], please contact our office at [phone] between the hours of 8:00 a.m. and 4:30 p.m.  Voicemails left after 4:00 p.m. will not be returned until the following business day.  For prescription refill requests, have your pharmacy contact our office and allow 72 hours.    Cancer Center Support Programs:   > Cancer Support Group  2nd Tuesday of the month 1pm-2pm, Journey Room

## 2017-12-11 NOTE — Progress Notes (Signed)
Pt states that he has not been taking the Afinitor at home as prescribed.

## 2017-12-12 ENCOUNTER — Encounter (HOSPITAL_COMMUNITY): Payer: Self-pay | Admitting: Lab

## 2017-12-12 NOTE — Progress Notes (Unsigned)
Referral sent to [facility]. Records faxed on 10/30 

## 2017-12-14 LAB — CHROMOGRANIN A: CHROMOGRANIN A: 270 nmol/L — AB (ref 0–5)

## 2019-10-13 IMAGING — DX DG CHEST 2V
2 series · 2 of 2 positions shown · non-contrast
Comparison: None.

CLINICAL DATA: 61 y/o M; fever, chills, night sweats, right-sided
chest pain, for 2 weeks. History of liver cancer and carcinoid tumor
of the ileum and colon.

EXAM:
CHEST - 2 VIEW

[chest pa]
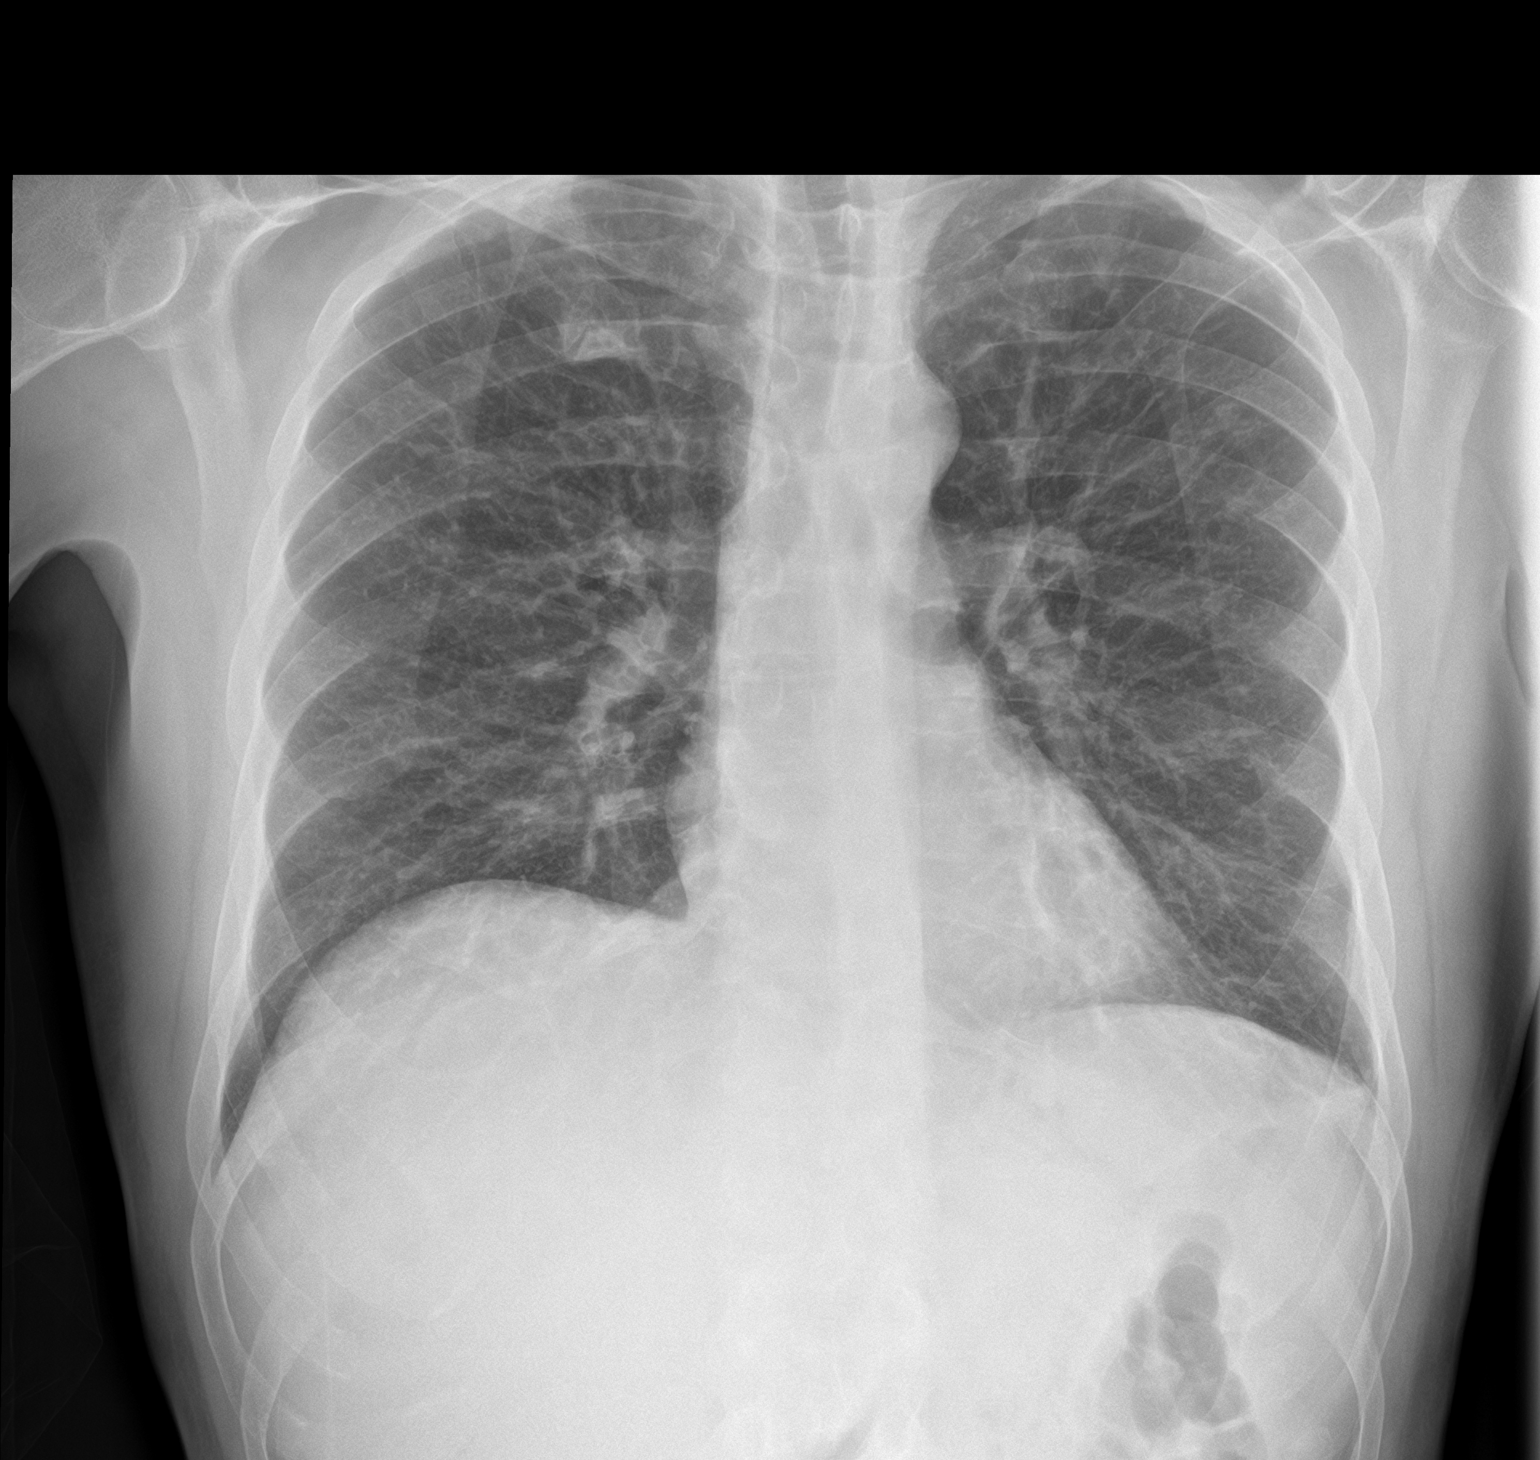

[chest lat]
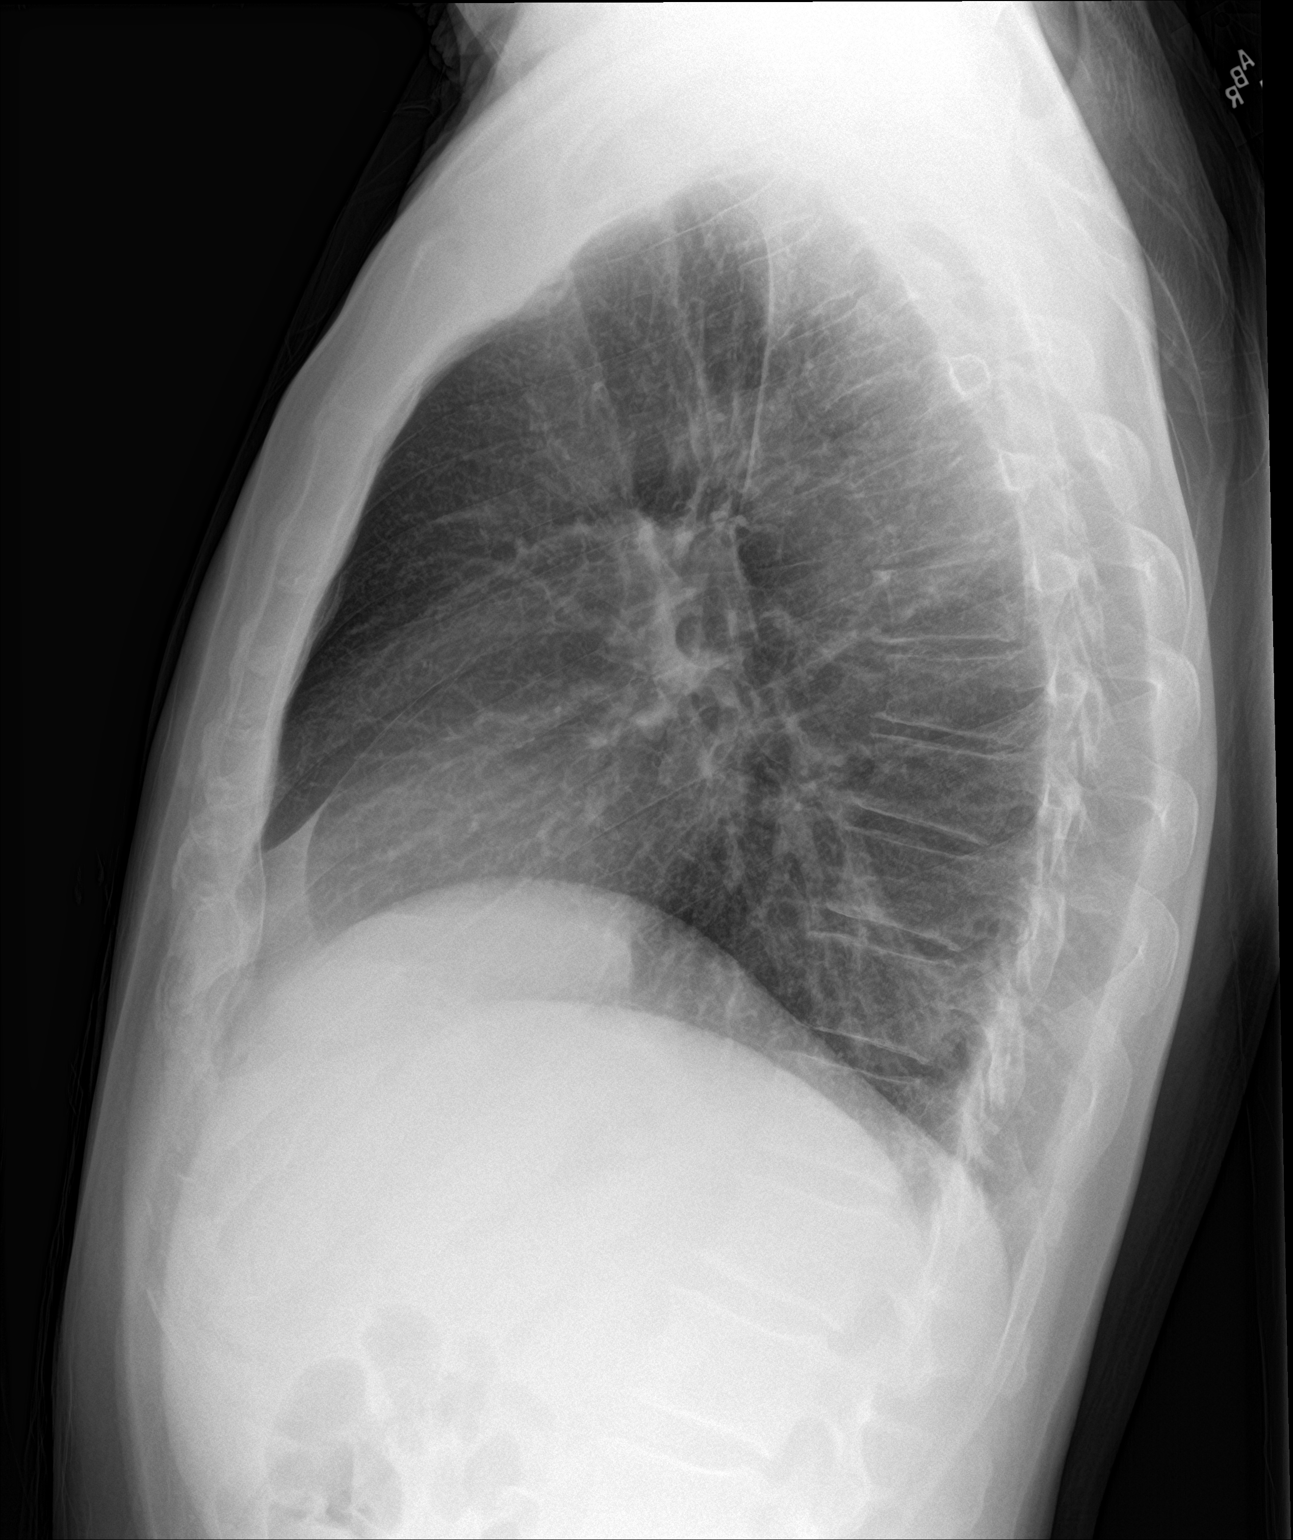

[2 of 2 positions shown; findings below may reference images not displayed]

FINDINGS: The heart size and mediastinal contours are within normal limits.
Both lungs are clear. The visualized skeletal structures are
unremarkable.
IMPRESSION: No active cardiopulmonary disease.

By: Heii Ceccaldi M.D.

## 2020-02-07 IMAGING — CT CT ABDOMEN W/ CM
3 of 5 series · 16 of 46 positions shown, 18 images · IV contrast (iopamidol)
Comparison: 07/04/2017

CLINICAL DATA: Metastatic neuroendocrine in [REDACTED] tumor to the
liver diagnosed. Carcinoid tumor in the right colon status post
resection in 1789.

EXAM:
CT ABDOMEN WITH CONTRAST
TECHNIQUE: Multidetector CT imaging of the abdomen was performed using the
standard protocol following bolus administration of intravenous
contrast.
CONTRAST:  100mL XOMCKT-ANN IOPAMIDOL (XOMCKT-ANN) INJECTION 61%

[Series 2: axial st · axial · 0.70mm/px · z∈[+1249,+1449]mm · 11 of 50 slices shown, 13 images]
[im 5/50  soft-tissue]
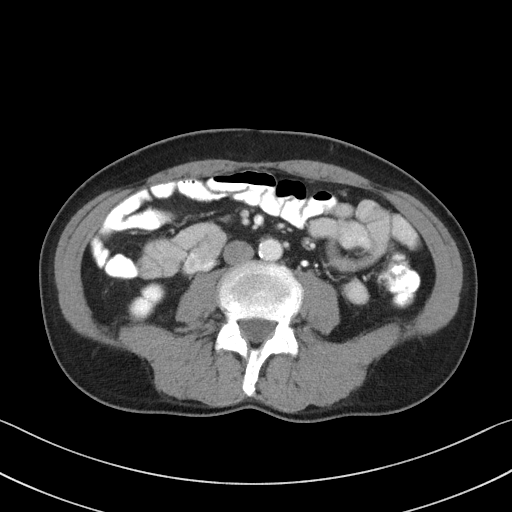
[im 5/50  bone]
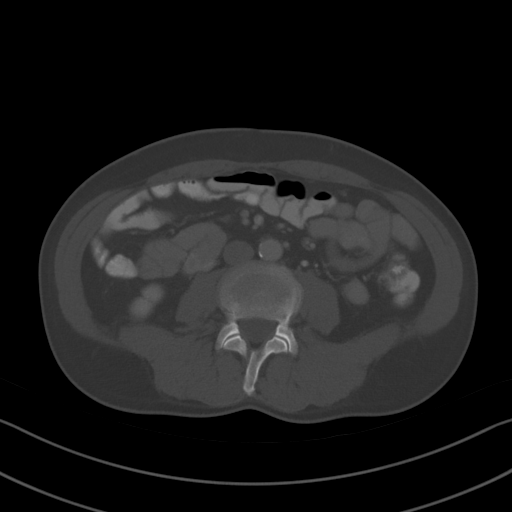
[im 9/50  soft-tissue]
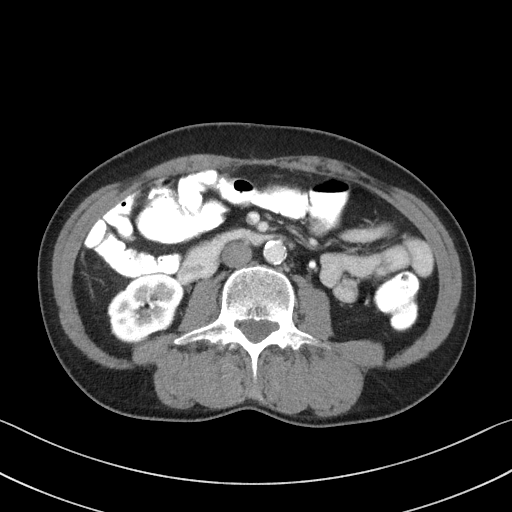
[im 13/50  soft-tissue]
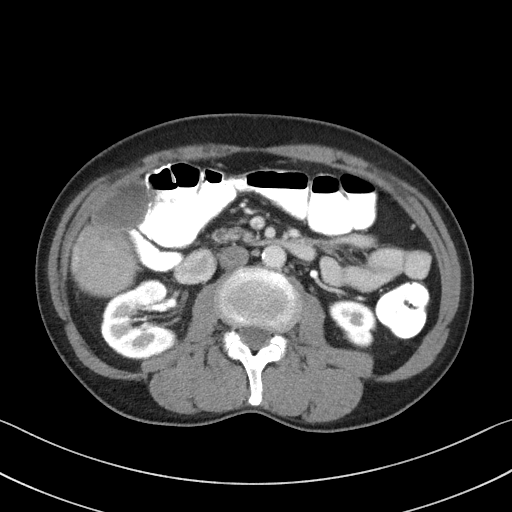
[im 17/50  soft-tissue]
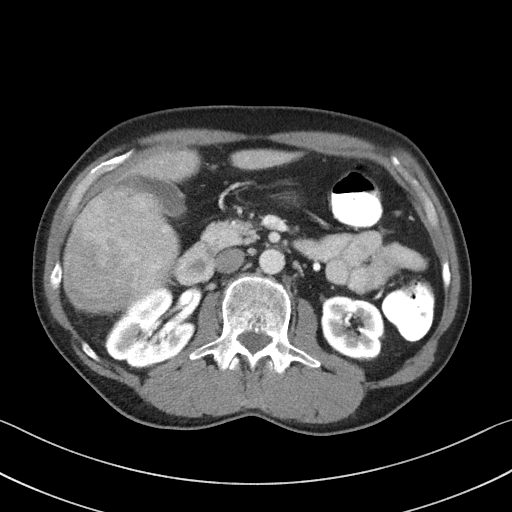
[im 21/50  soft-tissue]
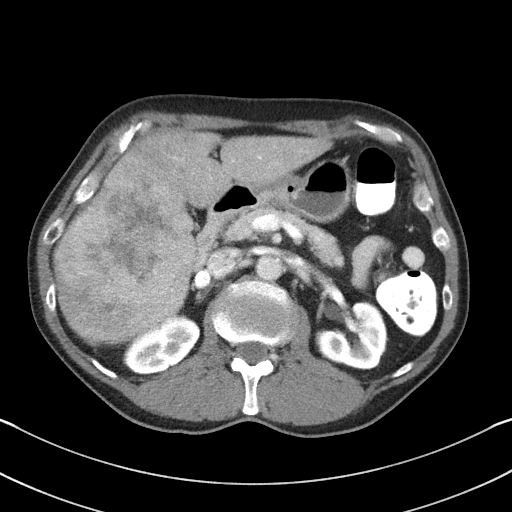
[im 25/50  soft-tissue]
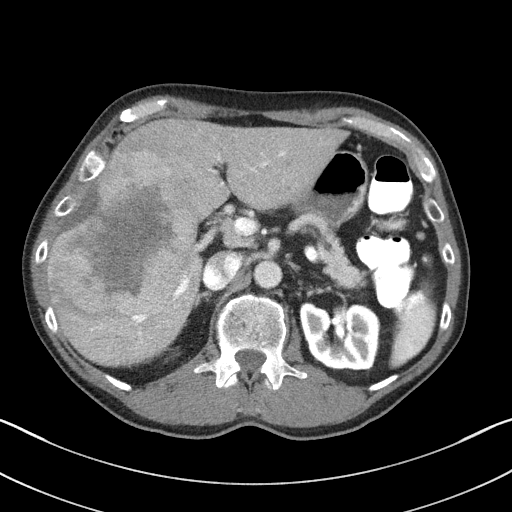
[im 29/50  soft-tissue]
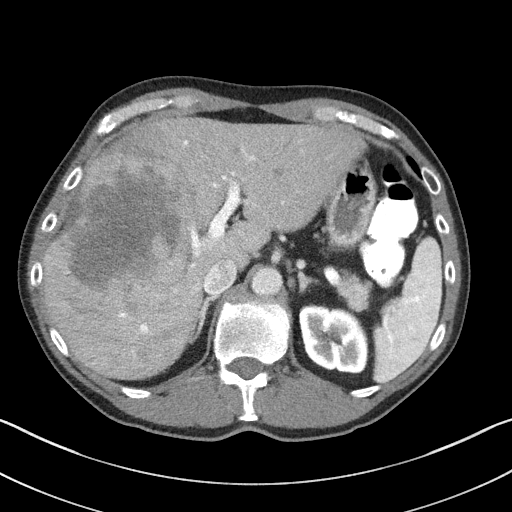
[im 33/50  soft-tissue]
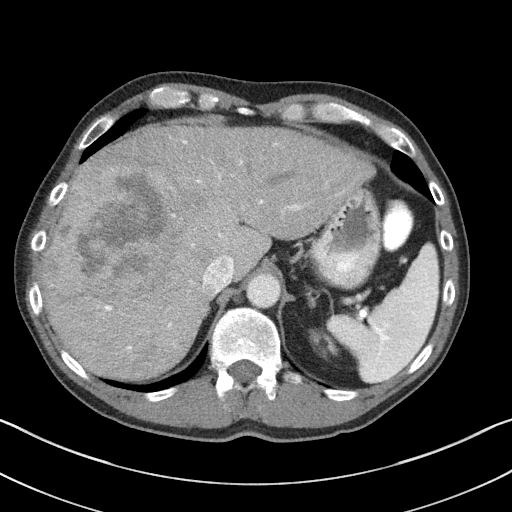
[im 37/50  soft-tissue]
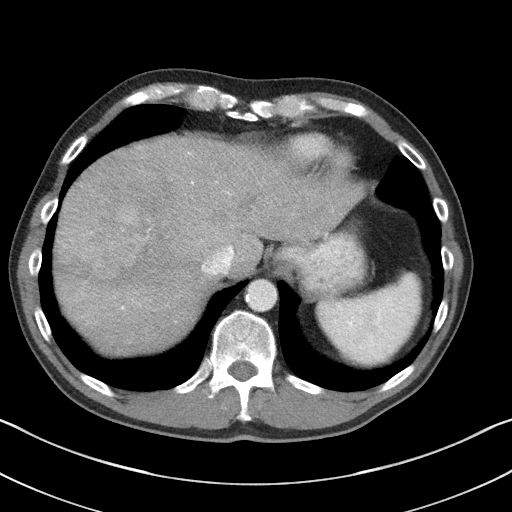
[im 37/50  bone]
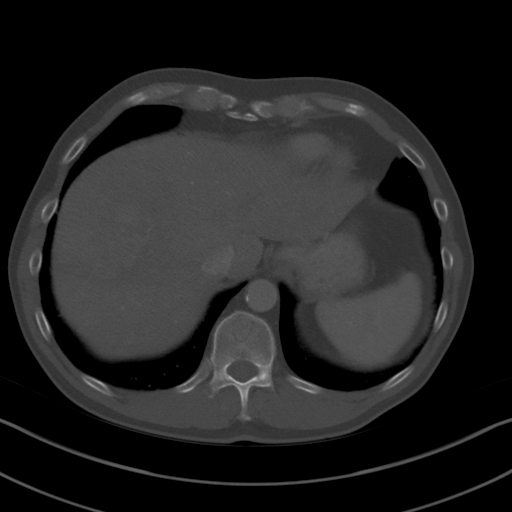
[im 41/50  soft-tissue]
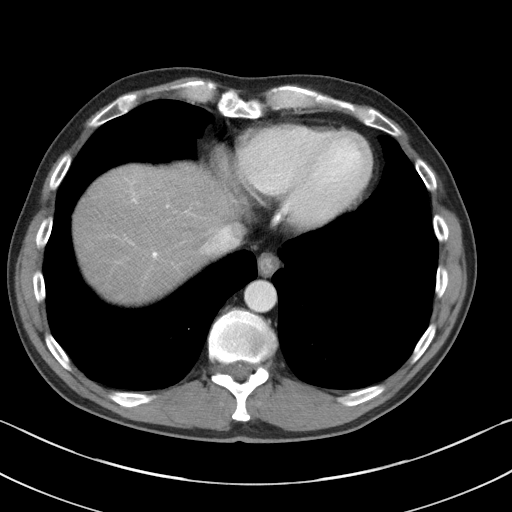
[im 45/50  soft-tissue]
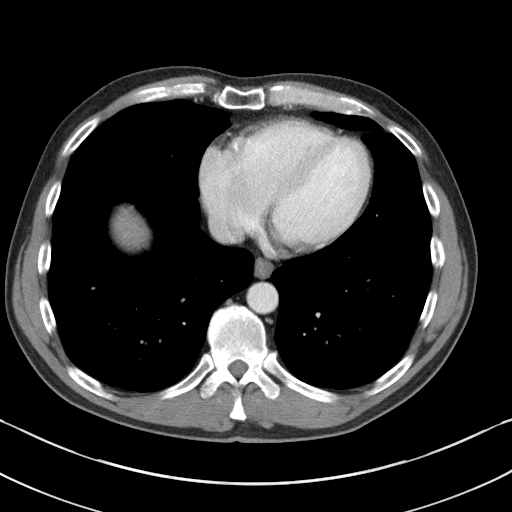

[Series 3: delay · axial · delayed · 0.70mm/px · z∈[+1244,+1284]mm · 2 of 51 slices shown]
[im 5/51  soft-tissue]
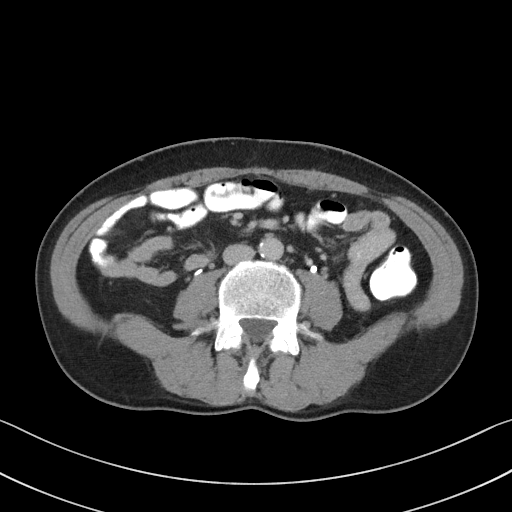
[im 13/51  soft-tissue]
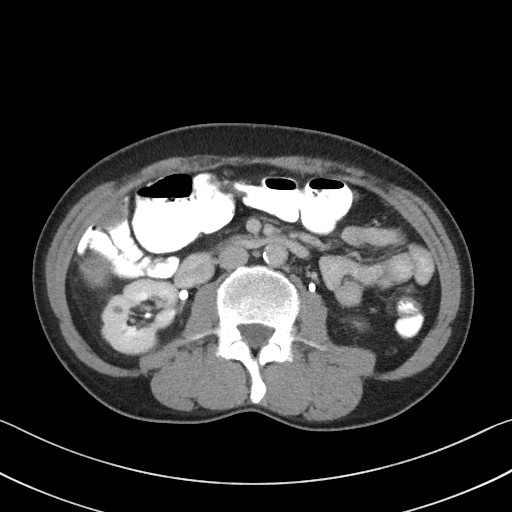

[Series 6: coronal st · coronal · 0.52mm/px · 3 of 90 slices shown]
[im 30/90  soft-tissue]
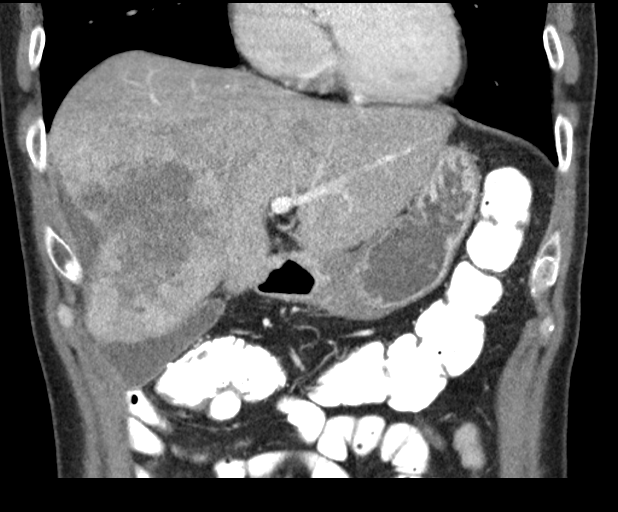
[im 40/90  soft-tissue]
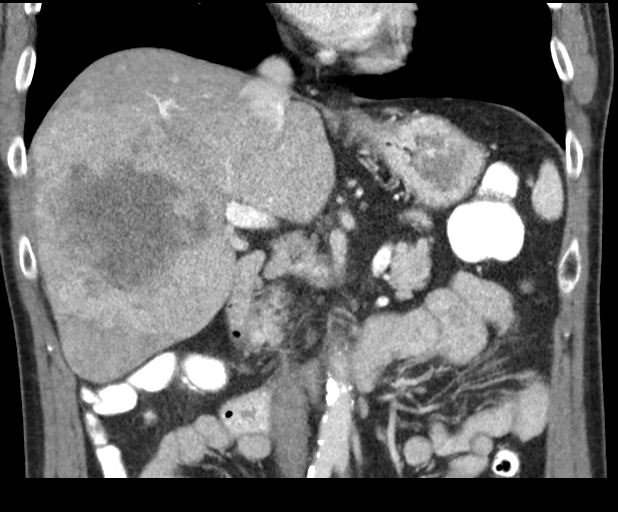
[im 50/90  soft-tissue]
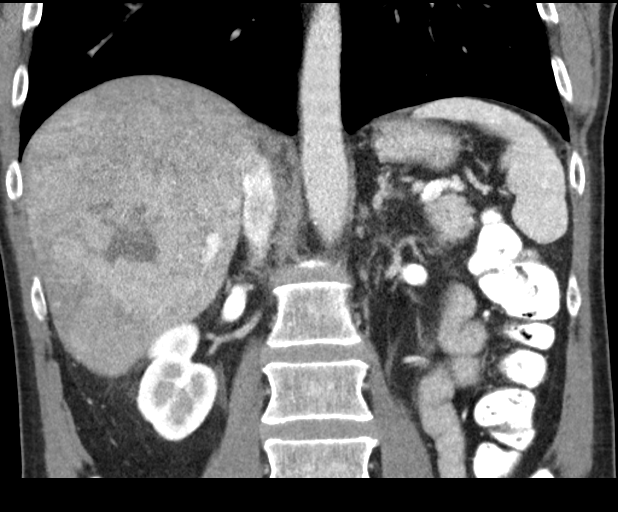

[16 of 46 positions shown; findings below may reference images not displayed]

FINDINGS: Lower chest: Clear lung bases. Normal heart size without pericardial
or pleural effusion.

Hepatobiliary: Dominant mass centered in the anterior right hepatic
lobe measures 11.2 x 9.0 cm on [DATE] versus 10.8 x 8.1 cm on the
prior. 11.3 cm craniocaudal today versus 11.2 cm on the prior exam
(when remeasured).

High left hepatic lobe 1.9 cm lesion on image [DATE] measures 1.8 cm
on the prior.

Just inferior to this, a lesion of 2.3 cm on [DATE] measured 1.9 cm
previously.

The most inferior segment 4A lesion measures 2.2 cm on [DATE] versus
1.9 cm on the prior.

Small posterior right hepatic lobe lesions including on [DATE], either
new or more conspicuous today. Possible developing hepatic dome
lesion including on [DATE]. Normal gallbladder, without biliary ductal
dilatation.

Pancreas: Normal, without mass or ductal dilatation.

Spleen: Normal in size, without focal abnormality.

Adrenals/Urinary Tract: Normal adrenal glands. Too small to
characterize lesions in both kidneys. No hydronephrosis.

Stomach/Bowel: Normal stomach, without wall thickening. Right
hemicolectomy. Normal small bowel.

Vascular/Lymphatic: Aortic and branch vessel atherosclerosis.
Portacaval node measures 1.5 cm on [DATE] versus 1.1 cm on the prior
exam.

Other: No ascites.  No evidence of omental or peritoneal disease.

Musculoskeletal: No acute osseous abnormality.
IMPRESSION: 1. Mild progression of hepatic metastasis.
2. Developing portal caval adenopathy, suspicious for nodal
metastasis.
3.  Aortic Atherosclerosis ([id number]).

## 2020-07-14 DEATH — deceased
